# Patient Record
Sex: Male | Born: 1995 | Race: White | Hispanic: No | Marital: Single | Smoking: Former smoker
Health system: Southern US, Community
[De-identification: ages and names within clinical notes are randomized; demographics above are authoritative.]

## PROBLEM LIST (undated history)

## (undated) DIAGNOSIS — J45909 Unspecified asthma, uncomplicated: Secondary | ICD-10-CM

## (undated) HISTORY — PX: NO PAST SURGERIES: SHX2092

---
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
## 2014-06-03 ENCOUNTER — Emergency Department: Payer: Self-pay | Admitting: Emergency Medicine

## 2014-08-17 ENCOUNTER — Emergency Department
Admission: EM | Admit: 2014-08-17 | Discharge: 2014-08-17 | Disposition: A | Discharge status: Home / Self Care | Payer: No Typology Code available for payment source | Attending: Emergency Medicine | Admitting: Emergency Medicine

## 2014-08-17 ENCOUNTER — Encounter: Payer: Self-pay | Admitting: Emergency Medicine

## 2014-08-17 DIAGNOSIS — F329 Major depressive disorder, single episode, unspecified: Secondary | ICD-10-CM | POA: Insufficient documentation

## 2014-08-17 DIAGNOSIS — F1910 Other psychoactive substance abuse, uncomplicated: Secondary | ICD-10-CM

## 2014-08-17 DIAGNOSIS — F1510 Other stimulant abuse, uncomplicated: Secondary | ICD-10-CM | POA: Diagnosis not present

## 2014-08-17 DIAGNOSIS — F1210 Cannabis abuse, uncomplicated: Secondary | ICD-10-CM | POA: Diagnosis not present

## 2014-08-17 DIAGNOSIS — Z87891 Personal history of nicotine dependence: Secondary | ICD-10-CM | POA: Insufficient documentation

## 2014-08-17 DIAGNOSIS — F1410 Cocaine abuse, uncomplicated: Secondary | ICD-10-CM | POA: Insufficient documentation

## 2014-08-17 DIAGNOSIS — F1110 Opioid abuse, uncomplicated: Secondary | ICD-10-CM | POA: Insufficient documentation

## 2014-08-17 LAB — URINALYSIS COMPLETE WITH MICROSCOPIC (ARMC ONLY)
Bilirubin Urine: NEGATIVE
Glucose, UA: NEGATIVE mg/dL
Hgb urine dipstick: NEGATIVE
Ketones, ur: NEGATIVE mg/dL
LEUKOCYTES UA: NEGATIVE
Nitrite: NEGATIVE
PROTEIN: NEGATIVE mg/dL
SPECIFIC GRAVITY, URINE: 1.028 (ref 1.005–1.030)
pH: 5 (ref 5.0–8.0)

## 2014-08-17 LAB — URINE DRUG SCREEN, QUALITATIVE (ARMC ONLY)
AMPHETAMINES, UR SCREEN: POSITIVE — AB
BARBITURATES, UR SCREEN: NOT DETECTED
Benzodiazepine, Ur Scrn: NOT DETECTED
CANNABINOID 50 NG, UR ~~LOC~~: POSITIVE — AB
COCAINE METABOLITE, UR ~~LOC~~: POSITIVE — AB
MDMA (ECSTASY) UR SCREEN: NOT DETECTED
Methadone Scn, Ur: NOT DETECTED
OPIATE, UR SCREEN: POSITIVE — AB
Phencyclidine (PCP) Ur S: NOT DETECTED
TRICYCLIC, UR SCREEN: NOT DETECTED

## 2014-08-17 LAB — COMPREHENSIVE METABOLIC PANEL
ALT: 640 U/L — ABNORMAL HIGH (ref 17–63)
ANION GAP: 10 (ref 5–15)
AST: 317 U/L — AB (ref 15–41)
Albumin: 4.7 g/dL (ref 3.5–5.0)
Alkaline Phosphatase: 148 U/L — ABNORMAL HIGH (ref 38–126)
BILIRUBIN TOTAL: 1.4 mg/dL — AB (ref 0.3–1.2)
BUN: 12 mg/dL (ref 6–20)
CALCIUM: 9.5 mg/dL (ref 8.9–10.3)
CHLORIDE: 103 mmol/L (ref 101–111)
CO2: 26 mmol/L (ref 22–32)
CREATININE: 0.87 mg/dL (ref 0.61–1.24)
GLUCOSE: 108 mg/dL — AB (ref 65–99)
Potassium: 3.6 mmol/L (ref 3.5–5.1)
Sodium: 139 mmol/L (ref 135–145)
Total Protein: 8 g/dL (ref 6.5–8.1)

## 2014-08-17 LAB — CBC
HCT: 46.5 % (ref 40.0–52.0)
Hemoglobin: 15.4 g/dL (ref 13.0–18.0)
MCH: 30 pg (ref 26.0–34.0)
MCHC: 33 g/dL (ref 32.0–36.0)
MCV: 90.7 fL (ref 80.0–100.0)
Platelets: 272 10*3/uL (ref 150–440)
RBC: 5.12 MIL/uL (ref 4.40–5.90)
RDW: 14.9 % — ABNORMAL HIGH (ref 11.5–14.5)
WBC: 14.8 10*3/uL — ABNORMAL HIGH (ref 3.8–10.6)

## 2014-08-17 LAB — SALICYLATE LEVEL: Salicylate Lvl: <4 mg/dL (ref 2.8–30.0)

## 2014-08-17 LAB — ACETAMINOPHEN LEVEL

## 2014-08-17 LAB — ETHANOL

## 2014-08-17 MED ORDER — LORAZEPAM 1 MG PO TABS
ORAL_TABLET | ORAL | Status: AC
  Administered 2014-08-17: 1 mg via ORAL
  Filled 2014-08-17: qty 1

## 2014-08-17 MED ORDER — IBUPROFEN 100 MG/5ML PO SUSP: 600.0000 mg | Freq: Once | ORAL | Status: DC

## 2014-08-17 MED ORDER — IBUPROFEN 600 MG PO TABS
ORAL_TABLET | ORAL | Status: AC
  Administered 2014-08-17: 600 mg via ORAL
  Filled 2014-08-17: qty 1

## 2014-08-17 MED ORDER — LORAZEPAM 1 MG PO TABS
1.0000 mg | ORAL_TABLET | Freq: Once | ORAL | Status: AC
  Administered 2014-08-17: 1 mg via ORAL

## 2014-08-17 MED ORDER — SODIUM CHLORIDE 0.9 % IV BOLUS (SEPSIS): 1000.0000 mL | Freq: Once | INTRAVENOUS | Status: DC

## 2014-08-17 MED ORDER — IBUPROFEN 600 MG PO TABS
600.0000 mg | ORAL_TABLET | Freq: Once | ORAL | Status: AC
  Administered 2014-08-17: 600 mg via ORAL

## 2014-08-17 MED ORDER — NICOTINE 10 MG IN INHA
RESPIRATORY_TRACT | Status: AC
  Administered 2014-08-17: 1 via RESPIRATORY_TRACT
  Filled 2014-08-17: qty 36

## 2014-08-17 MED ORDER — NICOTINE 10 MG IN INHA
1.0000 | RESPIRATORY_TRACT | Status: DC | PRN
  Administered 2014-08-17 (×2): 1 via RESPIRATORY_TRACT

## 2014-08-17 NOTE — ED Provider Notes (Signed)
[facility] Emergency Department Provider Note  ____________________________________________  Time seen: Approximately 624 AM  I have reviewed the triage vital signs and the nursing notes.   HISTORY  Chief Complaint Drug Problem    HPI Jesse Sampson is a 19 y.o. male who comes in today requesting drug detox. The patient reports that he is withdrawing off of heroin. He reports that he has been using heroin daily for the last 6 months. He reports that he last used yesterday but again wants to have detox. The patient has not had detox before. The patient denies any suicidal or homicidal ideation. Although he is withdrawing he reports that he has not had any belly pain vomiting or nausea. The patient does have some headache with no blurred vision. He reports he does feel as though he is mildly depressed.   History reviewed. No pertinent past medical history.  There are no active problems to display for this patient.   History reviewed. No pertinent past surgical history.  No current outpatient prescriptions on file.  Allergies Review of patient's allergies indicates no known allergies.  History reviewed. No pertinent family history.  Social History History  Substance Use Topics  . Smoking status: Former Games developermoker  . Smokeless tobacco: Not on file  . Alcohol Use: Yes    Review of Systems Constitutional: No fever/chills Eyes: No visual changes. ENT: No sore throat. Cardiovascular: Denies chest pain. Respiratory: Denies shortness of breath. Gastrointestinal: No abdominal pain.  No nausea, no vomiting.  No diarrhea.  No constipation. Genitourinary: Negative for dysuria. Musculoskeletal: Negative for back pain. Skin: Negative for rash. Neurological: Take with no focal weakness or numbness. Psychiatric:Depression 10-point ROS otherwise negative.  ____________________________________________   PHYSICAL EXAM:  VITAL SIGNS: ED Triage Vitals  Enc  Vitals Group     BP 08/17/14 0353 125/73 mmHg     Pulse Rate 08/17/14 0353 138     Resp 08/17/14 0353 18     Temp 08/17/14 0353 98.2 F (36.8 C)     Temp Source 08/17/14 0353 Oral     SpO2 08/17/14 0353 100 %     Weight 08/17/14 0353 145 lb (65.772 kg)     Height 08/17/14 0353 5\' 11"  (1.803 m)     Head Cir --      Peak Flow --      Pain Score 08/17/14 0354 5     Pain Loc --      Pain Edu? --      Excl. in GC? --     Constitutional: Alert and oriented. Well appearing and in no acute distress. Eyes: Conjunctivae are normal. PERRL. EOMI. Head: Atraumatic. Nose: No congestion/rhinnorhea. Mouth/Throat: Mucous membranes are dry.  Oropharynx non-erythematous. Cardiovascular: Tachycardia. Grossly normal heart sounds.  Good peripheral circulation. Respiratory: Normal respiratory effort.  No retractions. Lungs CTAB. Gastrointestinal: Soft and nontender. No distention. Positive bowel sounds. Genitourinary:  Musculoskeletal: No lower extremity tenderness nor edema.  No joint effusions. Neurologic:  Normal speech and language. No gross focal neurologic deficits are appreciated.  Skin:  Skin is warm, dry and intact. No rash noted. Psychiatric: Mood and affect are normal. Speech and behavior are normal.  ____________________________________________   LABS (all labs ordered are listed, but only abnormal results are displayed)  Labs Reviewed  CBC - Abnormal; Notable for the following:    WBC 14.8 (*)    RDW 14.9 (*)    All other components within normal limits  COMPREHENSIVE METABOLIC PANEL - Abnormal; Notable for  the following:    Glucose, Bld 108 (*)    AST 317 (*)    ALT 640 (*)    Alkaline Phosphatase 148 (*)    Total Bilirubin 1.4 (*)    All other components within normal limits  ACETAMINOPHEN LEVEL - Abnormal; Notable for the following:    Acetaminophen (Tylenol), Serum <10 (*)    All other components within normal limits  URINE DRUG SCREEN, QUALITATIVE ([facility]) - Abnormal;  Notable for the following:    Amphetamines, Ur Screen POSITIVE (*)    Cocaine Metabolite,Ur [place] POSITIVE (*)    Opiate, Ur Screen POSITIVE (*)    Cannabinoid 50 Ng, Ur [place] POSITIVE (*)    All other components within normal limits  URINALYSIS COMPLETEWITH MICROSCOPIC ([facility])  - Abnormal; Notable for the following:    Color, Urine AMBER (*)    APPearance CLEAR (*)    Bacteria, UA RARE (*)    Squamous Epithelial / LPF 0-5 (*)    All other components within normal limits  ETHANOL  SALICYLATE LEVEL   ____________________________________________  EKG  ED ECG REPORT   Date: 08/17/2014  EKG Time: 421  Rate: 104  Rhythm: sinus tachycardia, incomplete right bundle branch block  Axis: Right axis deviation  Intervals:none  ST&T Change: Flipped T-wave in lead 3  ____________________________________________  RADIOLOGY  None ____________________________________________   PROCEDURES  Procedure(s) performed: None  Critical Care performed: No  ____________________________________________   INITIAL IMPRESSION / ASSESSMENT AND PLAN / ED COURSE  Pertinent labs & imaging results that were available during my care of the patient were reviewed by me and considered in my medical decision making (see chart for details).  Patient is a 19 year old male who comes in for detox from heroin. According to the patient's urine drug screen he has also been doing cocaine as well as amphetamines and marijuana. The patient will be seen by the behavioral nurse who will attempt to locate place for him to receive detox. The patient received 1 L of normal saline for his tachycardia as well as 1 mg of Ativan orally.  The patient will be signed out pending discussion with the behavioral services. ____________________________________________   FINAL CLINICAL IMPRESSION(S) / ED DIAGNOSES  Final diagnoses:  Polysubstance abuse  Tachycardia       Rebecka ApleyAllison P Webster, MD 08/17/14 [phone]

## 2014-08-17 NOTE — ED Notes (Signed)
BEHAVIORAL HEALTH ROUNDING Patient sleeping: Yes.   Patient alert and oriented: not applicable Behavior appropriate: Yes.  ; If no, describe:  Nutrition and fluids offered: Yes  Toileting and hygiene offered: Yes  Sitter present: no Law enforcement present: Yes ods

## 2014-08-17 NOTE — ED Notes (Signed)

## 2014-08-17 NOTE — BH Assessment (Signed)
Assessment Note  Jesse Sampson is an 19 y.o. male, who presents to the ED via his mother; requesting assistance with heroin detox with stating, "I asked my mother to bring me; I need help; I keep slipping." "I use a lot; I'm using heroin IV 1/2 gram a day; adderall 4-6 pills a day; beer 8-9 12 oz 2-3 x a week; and cocaine-snorting 1/2 gram a day; "I'm just using; I have never had any kind of treatment."   Axis I: Substance Abuse Axis II: Deferred Axis III: History reviewed. No pertinent past medical history. Axis IV: problems with access to health care services and problems with primary support group Axis V: 61-70 mild symptoms  Past Medical History: History reviewed. No pertinent past medical history.  History reviewed. No pertinent past surgical history.  Family History: History reviewed. No pertinent family history.  Social History:  reports that he has quit smoking. He does not have any smokeless tobacco history on file. He reports that he drinks alcohol. He reports that he uses illicit drugs (Cocaine).  Additional Social History:     CIWA: CIWA-Ar BP: 125/73 mmHg Pulse Rate: (!) 138 Nausea and Vomiting: no nausea and no vomiting Tactile Disturbances: none Tremor: no tremor Auditory Disturbances: not present Paroxysmal Sweats: no sweat visible Visual Disturbances: not present Anxiety: no anxiety, at ease Headache, Fullness in Head: none present Agitation: normal activity Orientation and Clouding of Sensorium: oriented and can do serial additions CIWA-Ar Total: 0 COWS: Clinical Opiate Withdrawal Scale (COWS) Resting Pulse Rate: Pulse Rate greater than 120 Sweating: No report of chills or flushing Restlessness: Able to sit still Pupil Size: Pupils pinned or normal size for room light Bone or Joint Aches: Not present Runny Nose or Tearing: Not present GI Upset: No GI symptoms Tremor: No tremor Yawning: No yawning Anxiety or Irritability: None Gooseflesh Skin: Skin is  smooth COWS Total Score: 4  Allergies: No Known Allergies  Home Medications:  (Not in a hospital admission)  OB/GYN Status:  No LMP for male patient.  General Assessment Data [facility] Assessment: In system Is this a Tele or Face-to-Face Assessment?: Face-to-Face Is this an Initial Assessment or a Re-assessment for this encounter?: Initial Assessment Marital status: Single Maiden name: none Is patient pregnant?: No Pregnancy Status: No Living Arrangements: Parent Can pt return to current living arrangement?: Yes Admission Status: Voluntary Is patient capable of signing voluntary admission?: Yes Referral Source: Self/Family/Friend Insurance type: Self Pay  Medical Screening Exam [facility] Walk-in ONLY) Medical Exam completed: Yes  Crisis Care Plan Living Arrangements: Parent Name of Psychiatrist: none Name of Therapist: none  Education Status Is patient currently in school?: No Current Grade: n/a Highest grade of school patient has completed: 11th Name of school: n/a Contact person: mother  Risk to self with the past 6 months Suicidal Ideation: No Has patient been a risk to self within the past 6 months prior to admission? : No Suicidal Intent: No Has patient had any suicidal intent within the past 6 months prior to admission? : No Is patient at risk for suicide?: No Suicidal Plan?: No Has patient had any suicidal plan within the past 6 months prior to admission? : No Access to Means: No What has been your use of drugs/alcohol within the last 12 months?: heroin; adderall; beer; cocaine Previous Attempts/Gestures: No How many times?: 0 Other Self Harm Risks: substance use Triggers for Past Attempts: Family contact Intentional Self Injurious Behavior: None Family Suicide History: No Recent  stressful life event(s): Conflict (Comment) Persecutory voices/beliefs?: No Depression: Yes Depression Symptoms: Feeling worthless/self pity Substance  abuse history and/or treatment for substance abuse?:  (has been using since the of 14; no h/o treatment) Suicide prevention information given to non-admitted patients: Yes  Risk to Others within the past 6 months Homicidal Ideation: No Does patient have any lifetime risk of violence toward others beyond the six months prior to admission? : No Thoughts of Harm to Others: No Current Homicidal Intent: No Current Homicidal Plan: No Access to Homicidal Means: No Identified Victim: none History of harm to others?: No Assessment of Violence: None Noted Violent Behavior Description: none Does patient have access to weapons?: No Criminal Charges Pending?: No Does patient have a court date: No Is patient on probation?: No  Psychosis Hallucinations: None noted Delusions: None noted  Mental Status Report Appearance/Hygiene: Poor hygiene, Disheveled, Body odor, In scrubs Eye Contact: Fair Motor Activity: Restlessness (constant foot movement) Speech: Soft, Slow, Slurred Level of Consciousness: Quiet/awake Mood: Helpless ("I need help.") Affect: Anxious Anxiety Level: Minimal Thought Processes: Circumstantial, Relevant, Coherent Judgement: Partial Orientation: Person, Place, Time, Situation Obsessive Compulsive Thoughts/Behaviors: None  Cognitive Functioning Concentration: Fair Memory: Recent Intact, Remote Intact IQ: Average Insight: Fair Impulse Control: Good Appetite: Fair Weight Loss: 0 Weight Gain: 0 Sleep: No Change Total Hours of Sleep: 6 Vegetative Symptoms: None  ADLScreening [facility] Assessment Services) Patient's cognitive ability adequate to safely complete daily activities?: Yes Patient able to express need for assistance with ADLs?: Yes Independently performs ADLs?: Yes (appropriate for developmental age)  Prior Inpatient Therapy Prior Inpatient Therapy: No Prior Therapy Dates: none Prior Therapy Facilty/Provider(s): none Reason for Treatment: none  Prior  Outpatient Therapy Prior Outpatient Therapy: No Prior Therapy Dates: none Prior Therapy Facilty/Provider(s): none Reason for Treatment: none Does patient have an ACCT team?: No Does patient have Intensive In-House Services?  : No Does patient have [facility] services? : No Does patient have P4CC services?: No  ADL Screening (condition at time of admission) Patient's cognitive ability adequate to safely complete daily activities?: Yes Patient able to express need for assistance with ADLs?: Yes Independently performs ADLs?: Yes (appropriate for developmental age)       Abuse/Neglect Assessment (Assessment to be complete while patient is alone) Physical Abuse: Denies Verbal Abuse: Denies Sexual Abuse: Denies Exploitation of patient/patient's resources: Denies Self-Neglect: Denies Values / Beliefs Cultural Requests During Hospitalization: None Spiritual Requests During Hospitalization: None Consults Spiritual Care Consult Needed: No Social Work Consult Needed: No Merchant navy officerAdvance Directives (For Healthcare) Does patient have an advance directive?: No Would patient like information on creating an advanced directive?: No - patient declined information    Additional Information 1:1 In Past 12 Months?: No CIRT Risk: No Elopement Risk: No Does patient have medical clearance?: Yes  Child/Adolescent Assessment Running Away Risk: Denies Bed-Wetting: Denies Destruction of Property: Denies Cruelty to Animals: Denies Stealing: Denies Rebellious/Defies Authority: Denies Satanic Involvement: Denies Archivistire Setting: Denies Problems at Progress EnergySchool: Denies Gang Involvement: Denies  Disposition:  Disposition Initial Assessment Completed for this Encounter: Yes Disposition of Patient: Referred to, Inpatient treatment program Type of inpatient treatment program: Adult Patient referred to: [facility]  On Site Evaluation by:   Reviewed with Physician:    Dwan BoltMargaret Merve Hotard 08/17/2014 5:22 AM

## 2014-08-17 NOTE — ED Notes (Signed)
BEHAVIORAL HEALTH ROUNDING Patient sleeping: No. Patient alert and oriented: yes Behavior appropriate: Yes.  ; If no, describe:  Nutrition and fluids offered: Yes  Toileting and hygiene offered: Yes  Sitter present: no Law enforcement present: Yes  

## 2014-08-17 NOTE — [facility] Counselor (Signed)
Writer spoke with [facility] [phone]([phone]), they will not have any beds until Monday (08/19/2014) but they will start taking pt. From there ER first. Thus, their is no guarantee if outside referrals will be considered.

## 2014-08-17 NOTE — ED Provider Notes (Signed)
-----------------------------------------   3:27 PM on 08/17/2014 -----------------------------------------    Behavioral health has seen patient and as discussed patient to go to [facility] in [place]. Patient will have to transport himself to [facility]. Will give patient [facility] addressed and discharged with a work.  Jesse SemenGraydon Georgiana Spillane, MD 08/17/14 [phone]

## 2014-08-17 NOTE — Discharge Instructions (Signed)
Please seek medical attention for any high fevers, chest pain, shortness of breath, change in behavior, persistent vomiting, bloody stool or any other new or concerning symptoms.  Chemical Dependency Chemical dependency is an addiction to drugs or alcohol. It is characterized by the repeated behavior of seeking out and using drugs and alcohol despite harmful consequences to the health and safety of ones self and others.  RISK FACTORS There are certain situations or behaviors that increase a person's risk for chemical dependency. These include:  A family history of chemical dependency.  A history of mental health issues, including depression and anxiety.  A home environment where drugs and alcohol are easily available to you.  Drug or alcohol use at a young age. SYMPTOMS  The following symptoms can indicate chemical dependency:  Inability to limit the use of drugs or alcohol.  Nausea, sweating, shakiness, and anxiety that occurs when alcohol or drugs are not being used.  An increase in amount of drugs or alcohol that is necessary to get drunk or high. People who experience these symptoms can assess their use of drugs and alcohol by asking themselves the following questions:  Have you been told by friends or family that they are worried about your use of alcohol or drugs?  Do friends and family ever tell you about things you did while drinking alcohol or using drugs that you do not remember?  Do you lie about using alcohol or drugs or about the amounts you use?  Do you have difficulty completing daily tasks unless you use alcohol or drugs?  Is the level of your work or school performance lower because of your drug or alcohol use?  Do you get sick from using drugs or alcohol but keep using anyway?  Do you feel uncomfortable in social situations unless you use alcohol or drugs?  Do you use drugs or alcohol to help forget problems? An answer of yes to any of these questions may  indicate chemical dependency. Professional evaluation is suggested. Document Released: 03/16/2001 Document Revised: 06/14/2011 Document Reviewed: 05/28/2010 [facility] Patient Information 2015 [place]. This information is not intended to replace advice given to you by your health care provider. Make sure you discuss any questions you have with your health care provider.

## 2014-08-17 NOTE — ED Notes (Signed)
BEHAVIORAL HEALTH ROUNDING Patient sleeping: Yes.   Patient alert and oriented: no Behavior appropriate: Yes.  ; If no, describe:  Nutrition and fluids offered: Sleeping  Toileting and hygiene offered: Sleeping  Sitter present: yes Law enforcement present: Yes

## 2014-08-17 NOTE — ED Notes (Signed)
BEHAVIORAL HEALTH ROUNDING Patient sleeping: No. Patient alert and oriented: yes Behavior appropriate: Yes.  ; If no, describe: Nutrition and fluids offered: Yes  Toileting and hygiene offered: Yes  Sitter present: no Law enforcement present: Yes ods 

## 2014-08-17 NOTE — [facility] Counselor (Signed)
Discussed pt. with ER MD (Dr. Virginia CrewsG. Goodman) and , pt. is able to d/c home when medically cleared. Pt. have been giving information and instructions on how to follow up with [facility] and [facility].

## 2014-08-17 NOTE — [facility] Counselor (Signed)
Explored options for treatment with pt. [facility]ax[facility] to [facility].   Unable to go to [facility]([phone]) due to having insurance.  [facility] ([phone]), is unable to verify pt. Insurance and possible co-pay until Monday (08/19/2014).  [facility] ([phone]), have no beds

## 2014-08-17 NOTE — ED Notes (Signed)
BEHAVIORAL HEALTH ROUNDING Patient sleeping: Yes.   Patient alert and oriented: no Behavior appropriate: Yes.  ; If no, describe:  Nutrition and fluids offered: No Toileting and hygiene offered: No Sitter present: no Law enforcement present: Yes ods

## 2014-08-17 NOTE — ED Notes (Signed)
Pt here for heroin detox, has been using for 4 months, no SI.

## 2014-12-11 ENCOUNTER — Ambulatory Visit
Admission: EM | Admit: 2014-12-11 | Discharge: 2014-12-11 | Disposition: A | Discharge status: Home / Self Care | Payer: No Typology Code available for payment source | Attending: Family Medicine | Admitting: Family Medicine

## 2014-12-11 DIAGNOSIS — J302 Other seasonal allergic rhinitis: Secondary | ICD-10-CM

## 2014-12-11 DIAGNOSIS — J0110 Acute frontal sinusitis, unspecified: Secondary | ICD-10-CM

## 2014-12-11 DIAGNOSIS — J40 Bronchitis, not specified as acute or chronic: Secondary | ICD-10-CM | POA: Diagnosis not present

## 2014-12-11 HISTORY — DX: Unspecified asthma, uncomplicated: J45.909

## 2014-12-11 MED ORDER — AZITHROMYCIN 250 MG PO TABS: ORAL_TABLET | ORAL | Status: DC

## 2014-12-11 MED ORDER — LORATADINE 10 MG PO TABS: 10.0000 mg | ORAL_TABLET | Freq: Every day | ORAL | Status: DC

## 2014-12-11 MED ORDER — PSEUDOEPH-BROMPHEN-DM 30-2-10 MG/5ML PO SYRP: 5.0000 mL | ORAL_SOLUTION | Freq: Four times a day (QID) | ORAL | Status: DC | PRN

## 2014-12-11 NOTE — ED Provider Notes (Signed)
[facility] Emergency Department Provider Note  ____________________________________________  Time seen: Approximately 8:53 AM  I have reviewed the triage vital signs and the nursing notes.   HISTORY  Chief Complaint URI   HPI Jesse Sampson is a 19 y.o. male presents for complaints of runny nose, congestion, sinus pressure and intermittent cough. States started out with congestion and sinus pressure but now with intermittent cough. States cough is mostly at night with post nasal drainage. Denies fevers. Reports continues to eat and drink well. States has seasonal allergies every year at this time due to ragweed. 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   Denies chest pain, shortness of breath, abdominal pain, fevers. Reports continues to eat and drink well. Denies wheezing or shortness of breath.    Past Medical History  Diagnosis Date  . Asthma     There are no active problems to display for this patient.   History reviewed. No pertinent past surgical history.  Current Outpatient Rx  Name  Route  Sig  Dispense  Refill  . albuterol (PROVENTIL HFA;VENTOLIN HFA) 108 (90 BASE) MCG/ACT inhaler   Inhalation   Inhale 2 puffs into the lungs every 6 (six) hours as needed for wheezing or shortness of breath.         .             Allergies Sulfa antibiotics  History reviewed. No pertinent family history.  Social History Social History  Substance Use Topics  . Smoking status: Current Every Day Smoker -- 1.00 packs/day    Types: Cigarettes  . Smokeless tobacco: None  . Alcohol Use: Yes  Denies drug use.   Review of Systems Constitutional: No fever/chills Eyes: No visual changes. ENT: Positive for runny nose, congestion and intermittent cough. Negative for sore throat. Cardiovascular: Denies chest pain. Respiratory: Denies shortness of breath. Gastrointestinal: No abdominal pain.  No nausea, no vomiting.  No  diarrhea.  No constipation. Genitourinary: Negative for dysuria. Musculoskeletal: Negative for back pain. Skin: Negative for rash. Neurological: Negative for headaches, focal weakness or numbness.  10-point ROS otherwise negative.  ____________________________________________   PHYSICAL EXAM:  VITAL SIGNS: ED Triage Vitals  Enc Vitals Group     BP 12/11/14 0831 105/69 mmHg     Pulse Rate 12/11/14 0831 92     Resp 12/11/14 0831 17     Temp 12/11/14 0831 98.2 F (36.8 C)     Temp Source 12/11/14 0831 Tympanic     SpO2 12/11/14 0831 99 %     Weight 12/11/14 0831 155 lb (70.308 kg)     Height 12/11/14 0831  (1.88 m)     Head Cir --      Peak Flow --      Pain Score 12/11/14 0834 6     Pain Loc --      Pain Edu? --      Excl. in GC? --     Constitutional: Alert and oriented. Well appearing and in no acute distress. Eyes: Conjunctivae are normal. PERRL. EOMI. Head: Atraumatic.Mild maxillary sinus TTP, mod frontal sinus TTP. No erythema.   Ears: no erythema, normal TMs bilaterally.   Nose: Clear rhinorrhea. Bilateral nasal turbinate edema, bilateral nares patent.  Mouth/Throat: Mucous membranes are moist.  Oropharynx non-erythematous. Neck: No stridor.  No cervical spine tenderness to palpation. Hematological/Lymphatic/Immunilogical: No cervical lymphadenopathy. Cardiovascular: Normal rate, regular rhythm. Grossly normal heart sounds.  Good peripheral circulation. Respiratory: Normal respiratory effort.  No retractions.Lungs clear throughout bilaterally. No wheezes rales or rhonchi. Dry intermittent cough in room. Gastrointestinal: Soft and nontender. No distention. Normal Bowel sounds.No CVA tenderness. Musculoskeletal: No lower or upper extremity tenderness nor edema.  No joint effusions. Bilateral pedal pulses equal and easily palpated.  Neurologic:  Normal speech and language. No gross focal neurologic deficits are appreciated. No gait instability. Skin:  Skin is warm,  dry and intact. No rash noted. Psychiatric: Mood and affect are normal. Speech and behavior are normal.  ____________________________________________   LABS (all labs ordered are listed, but only abnormal results are displayed)  Labs Reviewed - No data to display   INITIAL IMPRESSION / ASSESSMENT AND PLAN / ED COURSE  Pertinent labs & imaging results that were available during my care of the patient were reviewed by me and considered in my medical decision making (see chart for details).  Very well appearing patient. No acute distress. Presents for 7-8 days of runny nose, congestion, sinus pressure with intermittent cough. States history of seasonal allergies which often triggered by ragweed. Will treat frontal sinusitis and bronchitis with oral azithromycin and prn bromfed. Discussed seasonal allergies medication and patient requests rx for claritin and will plan to start at completion of bromfed. Counseled regarding smoking cessation. Discussed follow up and return parameters. Patient verbalized understanding and agreed to plan.  ____________________________________________   FINAL CLINICAL IMPRESSION(S) / ED DIAGNOSES  Final diagnoses:  Acute frontal sinusitis, recurrence not specified  Bronchitis  Seasonal allergies       Renford Dills, NP 12/11/14 0912  Renford Dills, NP 12/11/14 9604

## 2014-12-11 NOTE — Discharge Instructions (Signed)
Take medication as prescribed. Rest. Drink plenty of fluids. Try to quit smoking as discussed.   Follow up with your primary care physician this week as needed. Return to Urgent care for new or worsening concerns.   Sinusitis Sinusitis is redness, soreness, and puffiness (inflammation) of the air pockets in the bones of your face (sinuses). The redness, soreness, and puffiness can cause air and mucus to get trapped in your sinuses. This can allow germs to grow and cause an infection.  HOME CARE   Drink enough fluids to keep your pee (urine) clear or pale yellow.  Use a humidifier in your home.  Run a hot shower to create steam in the bathroom. Sit in the bathroom with the door closed. Breathe in the steam 3-4 times a day.  Put a warm, moist washcloth on your face 3-4 times a day, or as told by your doctor.  Use salt water sprays (saline sprays) to wet the thick fluid in your nose. This can help the sinuses drain.  Only take medicine as told by your doctor. GET HELP RIGHT AWAY IF:   Your pain gets worse.  You have very bad headaches.  You are sick to your stomach (nauseous).  You throw up (vomit).  You are very sleepy (drowsy) all the time.  Your face is puffy (swollen).  Your vision changes.  You have a stiff neck.  You have trouble breathing. MAKE SURE YOU:   Understand these instructions.  Will watch your condition.  Will get help right away if you are not doing well or get worse. Document Released: 09/08/2007 Document Revised: 12/15/2011 Document Reviewed: 10/26/2011 [facility] Patient Information 2015 [place], [place]. This information is not intended to replace advice given to you by your health care provider. Make sure you discuss any questions you have with your health care provider.  Hay Fever Hay fever is an allergic reaction to particles in the air. It cannot be passed from person to person. It cannot be cured, but it can be controlled. CAUSES  Hay fever is  caused by something that triggers an allergic reaction (allergens). The following are examples of allergens:  Ragweed.  Feathers.  Animal dander.  Grass and tree pollens.  Cigarette smoke.  House dust.  Pollution. SYMPTOMS   Sneezing.  Runny or stuffy nose.  Tearing eyes.  Itchy eyes, nose, mouth, throat, skin, or other area.  Sore throat.  Headache.  Decreased sense of smell or taste. DIAGNOSIS Your caregiver will perform a physical exam and ask questions about the symptoms you are having.Allergy testing may be done to determine exactly what triggers your hay fever.  TREATMENT   Over-the-counter medicines may help symptoms. These include:  Antihistamines.  Decongestants. These may help with nasal congestion.  Your caregiver may prescribe medicines if over-the-counter medicines do not work.  Some people benefit from allergy shots when other medicines are not helpful. HOME CARE INSTRUCTIONS   Avoid the allergen that is causing your symptoms, if possible.  Take all medicine as told by your caregiver. SEEK MEDICAL CARE IF:   You have severe allergy symptoms and your current medicines are not helping.  Your treatment was working at one time, but you are now experiencing symptoms.  You have sinus congestion and pressure.  You develop a fever or headache.  You have thick nasal discharge.  You have asthma and have a worsening cough and wheezing. SEEK IMMEDIATE MEDICAL CARE IF:   You have swelling of your tongue or lips.  You have trouble breathing.  You feel lightheaded or like you are going to faint.  You have cold sweats.  You have a fever. Document Released: 03/22/2005 Document Revised: 06/14/2011 Document Reviewed: 06/17/2010 [facility] Patient Information 2015 [place], [place]. This information is not intended to replace advice given to you by your health care provider. Make sure you discuss any questions you have with your health care  provider.

## 2014-12-11 NOTE — ED Notes (Signed)
Started 1 week ago with sinus congestion, post nasal drip and now coughing "green" phlegm.

## 2015-05-28 ENCOUNTER — Observation Stay
Admission: EM | Admit: 2015-05-28 | Discharge: 2015-05-29 | Disposition: A | Discharge status: Home / Self Care | Payer: No Typology Code available for payment source | Attending: Internal Medicine | Admitting: Internal Medicine

## 2015-05-28 DIAGNOSIS — Z801 Family history of malignant neoplasm of trachea, bronchus and lung: Secondary | ICD-10-CM | POA: Insufficient documentation

## 2015-05-28 DIAGNOSIS — R0681 Apnea, not elsewhere classified: Secondary | ICD-10-CM | POA: Diagnosis present

## 2015-05-28 DIAGNOSIS — J4520 Mild intermittent asthma, uncomplicated: Secondary | ICD-10-CM | POA: Diagnosis not present

## 2015-05-28 DIAGNOSIS — Y929 Unspecified place or not applicable: Secondary | ICD-10-CM | POA: Diagnosis not present

## 2015-05-28 DIAGNOSIS — F1910 Other psychoactive substance abuse, uncomplicated: Secondary | ICD-10-CM | POA: Insufficient documentation

## 2015-05-28 DIAGNOSIS — F17210 Nicotine dependence, cigarettes, uncomplicated: Secondary | ICD-10-CM | POA: Insufficient documentation

## 2015-05-28 DIAGNOSIS — Z7951 Long term (current) use of inhaled steroids: Secondary | ICD-10-CM | POA: Insufficient documentation

## 2015-05-28 DIAGNOSIS — Z882 Allergy status to sulfonamides status: Secondary | ICD-10-CM | POA: Diagnosis not present

## 2015-05-28 DIAGNOSIS — T50901A Poisoning by unspecified drugs, medicaments and biological substances, accidental (unintentional), initial encounter: Secondary | ICD-10-CM | POA: Diagnosis present

## 2015-05-28 DIAGNOSIS — F10129 Alcohol abuse with intoxication, unspecified: Secondary | ICD-10-CM | POA: Diagnosis present

## 2015-05-28 DIAGNOSIS — F10929 Alcohol use, unspecified with intoxication, unspecified: Secondary | ICD-10-CM

## 2015-05-28 DIAGNOSIS — R092 Respiratory arrest: Secondary | ICD-10-CM | POA: Insufficient documentation

## 2015-05-28 DIAGNOSIS — I469 Cardiac arrest, cause unspecified: Secondary | ICD-10-CM

## 2015-05-28 DIAGNOSIS — T402X1A Poisoning by other opioids, accidental (unintentional), initial encounter: Secondary | ICD-10-CM | POA: Diagnosis not present

## 2015-05-28 LAB — COMPREHENSIVE METABOLIC PANEL
ALT: 84 U/L — AB (ref 17–63)
AST: 42 U/L — AB (ref 15–41)
Albumin: 4 g/dL (ref 3.5–5.0)
Alkaline Phosphatase: 59 U/L (ref 38–126)
Anion gap: 9 (ref 5–15)
BUN: 11 mg/dL (ref 6–20)
CHLORIDE: 112 mmol/L — AB (ref 101–111)
CO2: 22 mmol/L (ref 22–32)
CREATININE: 0.81 mg/dL (ref 0.61–1.24)
Calcium: 8.8 mg/dL — ABNORMAL LOW (ref 8.9–10.3)
GFR calc Af Amer: >60 mL/min (ref >60)
GFR calc non Af Amer: >60 mL/min (ref >60)
Glucose, Bld: 77 mg/dL (ref 65–99)
POTASSIUM: 3.5 mmol/L (ref 3.5–5.1)
SODIUM: 143 mmol/L (ref 135–145)
Total Bilirubin: 0.3 mg/dL (ref 0.3–1.2)
Total Protein: 6.7 g/dL (ref 6.5–8.1)

## 2015-05-28 LAB — URINE DRUG SCREEN, QUALITATIVE (ARMC ONLY)
AMPHETAMINES, UR SCREEN: NOT DETECTED
BARBITURATES, UR SCREEN: NOT DETECTED
BENZODIAZEPINE, UR SCRN: NOT DETECTED
Cannabinoid 50 Ng, Ur ~~LOC~~: NOT DETECTED
Cocaine Metabolite,Ur ~~LOC~~: NOT DETECTED
MDMA (Ecstasy)Ur Screen: NOT DETECTED
Methadone Scn, Ur: NOT DETECTED
OPIATE, UR SCREEN: POSITIVE — AB
Phencyclidine (PCP) Ur S: NOT DETECTED
Tricyclic, Ur Screen: NOT DETECTED

## 2015-05-28 LAB — CBC
HEMATOCRIT: 41.1 % (ref 40.0–52.0)
Hemoglobin: 13.9 g/dL (ref 13.0–18.0)
MCH: 31.1 pg (ref 26.0–34.0)
MCHC: 33.7 g/dL (ref 32.0–36.0)
MCV: 92.3 fL (ref 80.0–100.0)
PLATELETS: 260 10*3/uL (ref 150–440)
RBC: 4.45 MIL/uL (ref 4.40–5.90)
RDW: 13.7 % (ref 11.5–14.5)
WBC: 11.6 10*3/uL — ABNORMAL HIGH (ref 3.8–10.6)

## 2015-05-28 LAB — TROPONIN I: Troponin I: <0.03 ng/mL (ref <0.031)

## 2015-05-28 LAB — ETHANOL: ALCOHOL ETHYL (B): 166 mg/dL — AB (ref <5)

## 2015-05-28 MED ORDER — SODIUM CHLORIDE 0.9 % IV BOLUS (SEPSIS)
1000.0000 mL | Freq: Once | INTRAVENOUS | Status: AC
Start: 2015-05-28 — End: 2015-05-28
  Administered 2015-05-28: 1000 mL via INTRAVENOUS

## 2015-05-28 MED ORDER — ACETAMINOPHEN 650 MG RE SUPP: 650.0000 mg | Freq: Four times a day (QID) | RECTAL | Status: DC | PRN

## 2015-05-28 MED ORDER — SODIUM CHLORIDE 0.9% FLUSH
3.0000 mL | Freq: Two times a day (BID) | INTRAVENOUS | Status: DC
  Administered 2015-05-29: 3 mL via INTRAVENOUS

## 2015-05-28 MED ORDER — ACETAMINOPHEN 325 MG PO TABS
650.0000 mg | ORAL_TABLET | Freq: Four times a day (QID) | ORAL | Status: DC | PRN
  Administered 2015-05-29: 650 mg via ORAL
  Filled 2015-05-28: qty 2

## 2015-05-28 MED ORDER — NICOTINE 21 MG/24HR TD PT24
21.0000 mg | MEDICATED_PATCH | Freq: Once | TRANSDERMAL | Status: DC
  Administered 2015-05-28: 21 mg via TRANSDERMAL
  Filled 2015-05-28: qty 1

## 2015-05-28 MED ORDER — LORAZEPAM 0.5 MG PO TABS: 0.5000 mg | ORAL_TABLET | Freq: Two times a day (BID) | ORAL | Status: DC | PRN

## 2015-05-28 MED ORDER — ONDANSETRON HCL 4 MG/2ML IJ SOLN: 4.0000 mg | Freq: Four times a day (QID) | INTRAMUSCULAR | Status: DC | PRN

## 2015-05-28 MED ORDER — ONDANSETRON HCL 4 MG PO TABS: 4.0000 mg | ORAL_TABLET | Freq: Four times a day (QID) | ORAL | Status: DC | PRN

## 2015-05-28 MED ORDER — ENOXAPARIN SODIUM 40 MG/0.4ML ~~LOC~~ SOLN: 40.0000 mg | SUBCUTANEOUS | Status: DC

## 2015-05-28 MED ORDER — SODIUM CHLORIDE 0.9 % IV BOLUS (SEPSIS)
1000.0000 mL | Freq: Once | INTRAVENOUS | Status: AC
  Administered 2015-05-29: 1000 mL via INTRAVENOUS

## 2015-05-28 MED ORDER — DIPHENHYDRAMINE HCL 25 MG PO CAPS: 25.0000 mg | ORAL_CAPSULE | Freq: Two times a day (BID) | ORAL | Status: DC | PRN

## 2015-05-28 MED ORDER — POLYETHYLENE GLYCOL 3350 17 G PO PACK: 17.0000 g | PACK | Freq: Every day | ORAL | Status: DC | PRN

## 2015-05-28 NOTE — ED Notes (Signed)
Sheriff at bedside.  

## 2015-05-28 NOTE — ED Provider Notes (Signed)
[facility] Emergency Department Provider Note  ____________________________________________  Time seen: Approximately 8:56 PM  I have reviewed the triage vital signs and the nursing notes.   HISTORY  Chief Complaint Drug Overdose    HPI Jesse Sampson is a 20 y.o. male with a history of polysubstance abuse and alcohol dependence brought by EMS for cardiac arrest after overdose. The patient reports that around 7:30 PM this evening he injected a white powder into his right upper extremity which he was told was heroin " but it felt more like fentanyl."Then he called his mom and the events are unclear to him. Per report, the patient had a witnessed arrest and received several rounds of CPR by EMS. He received 2 mg intranasally of Narcan followed by 2 mg IV at which point he had return of spontaneous circulation and became alert and oriented with stable vital signs.  The patient also reports a significant amount of alcohol intake tonight.  He denies any SI, HI or hallucinations. He has been drug free except for marijuana for 3 months.   Past Medical History  Diagnosis Date  . Asthma     There are no active problems to display for this patient.   History reviewed. No pertinent past surgical history.  Current Outpatient Rx  Name  Route  Sig  Dispense  Refill  . albuterol (PROVENTIL HFA;VENTOLIN HFA) 108 (90 BASE) MCG/ACT inhaler   Inhalation   Inhale 2 puffs into the lungs every 6 (six) hours as needed for wheezing or shortness of breath.         Marland Kitchen azithromycin (ZITHROMAX Z-PAK) 250 MG tablet      Take 2 tablets (500 mg) on  Day 1,  followed by 1 tablet (250 mg) once daily on Days 2 through 5.   6 each   0   . brompheniramine-pseudoephedrine-DM 30-2-10 MG/5ML syrup   Oral   Take 5 mLs by mouth 4 (four) times daily as needed (cough congestion).   100 mL   0   . fluticasone (FLONASE) 50 MCG/ACT nasal spray   Each Nare   Place 2 sprays into both  nostrils daily.         Marland Kitchen loratadine (CLARITIN) 10 MG tablet   Oral   Take 1 tablet (10 mg total) by mouth daily.   30 tablet   0     Allergies Sulfa antibiotics  No family history on file.  Social History Social History  Substance Use Topics  . Smoking status: Current Every Day Smoker -- 1.00 packs/day    Types: Cigarettes  . Smokeless tobacco: None  . Alcohol Use: Yes    Review of Systems Constitutional: No fever/chills. Positive unresponsiveness with pulselessness and apnea. Eyes: No visual changes. ENT: No sore throat. Cardiovascular: Denies chest pain, palpitations. Respiratory: Denies shortness of breath.  No cough. Gastrointestinal: No abdominal pain.  No nausea, no vomiting.  No diarrhea.  No constipation. Genitourinary: Negative for dysuria. Musculoskeletal: Negative for back pain. Skin: Negative for rash. Neurological: Negative for headaches, focal weakness or numbness.  10-point ROS otherwise negative.  ____________________________________________   PHYSICAL EXAM:  VITAL SIGNS: ED Triage Vitals  Enc Vitals Group     BP 05/28/15 2032 116/82 mmHg     Pulse Rate 05/28/15 2032 93     Resp 05/28/15 2032 9     Temp 05/28/15 2032 98.6 F (37 C)     Temp src --      SpO2 05/28/15 2032  98 %     Weight 05/28/15 2032 157 lb (71.215 kg)     Height 05/28/15 2032 6' (1.829 m)     Head Cir --      Peak Flow --      Pain Score --      Pain Loc --      Pain Edu? --      Excl. in GC? --     Constitutional: On arrival, the patient is alert oriented and tearful. He is answering questions appropriately.  Eyes: Conjunctivae are normal.  EOMI. pupils are 3 mm and symmetric and reactive bilaterally. Head: Atraumatic. Nose: No congestion/rhinnorhea. Mouth/Throat: Mucous membranes are moist.  Neck: No stridor.  Supple.  No JVD. Cardiovascular: Fast rate, regular rhythm. No murmurs, rubs or gallops.  Respiratory: Normal respiratory effort.  No retractions.  Lungs CTAB.  No wheezes, rales or ronchi. Gastrointestinal: Soft and nontender. No distention. No peritoneal signs. Musculoskeletal: No LE edema.  Neurologic:  Normal speech and language. No gross focal neurologic deficits are appreciated.  Skin:  Skin is warm, dry and intact. Patient has a diffuse lenticular rash over the right inner bicep as well as the chest without any raised lesion, overlying purulence or erythema, which she states has been there "for while." Psychiatric: Mood and affect are normal. Speech and behavior are normal.  Normal judgement.  ____________________________________________   LABS (all labs ordered are listed, but only abnormal results are displayed)  Labs Reviewed  CBC - Abnormal; Notable for the following:    WBC 11.6 (*)    All other components within normal limits  URINE DRUG SCREEN, QUALITATIVE ([facility] ONLY) - Abnormal; Notable for the following:    Opiate, Ur Screen POSITIVE (*)    All other components within normal limits  ETHANOL - Abnormal; Notable for the following:    Alcohol, Ethyl (B) 166 (*)    All other components within normal limits  COMPREHENSIVE METABOLIC PANEL - Abnormal; Notable for the following:    Chloride 112 (*)    Calcium 8.8 (*)    AST 42 (*)    ALT 84 (*)    All other components within normal limits  TROPONIN I   ____________________________________________  EKG  ED ECG REPORT I, Rockne Menghini, the attending physician, personally viewed and interpreted this ECG.   Date: 05/28/2015  EKG Time: 2033  Rate: 91  Rhythm: normal sinus rhythm  Axis: Normal  Intervals:none  ST&T Change: Peak T waves in V3 V4 and V5. 0.5 mm ST elevations isolated to V2. Nonspecific T-wave inversions in V1.  ____________________________________________  RADIOLOGY  No results found.  ____________________________________________   PROCEDURES  Procedure(s) performed: None  Critical Care performed: Yes, see  note ____________________________________________   INITIAL IMPRESSION / ASSESSMENT AND PLAN / ED COURSE  Pertinent labs & imaging results that were available during my care of the patient were reviewed by me and considered in my medical decision making (see chart for details).  20 y.o. male with a history of alcohol and polysubstance abuse presenting with acute cardiac arrest in the setting of drug overdose. The likely cause was opioid overdose, although it is still not completely clear what type of drug the patient ingested. At this time, the patient is tachycardic but otherwise hemodynamically stable, alert and oriented, and protecting his airway. He will require admission for postresuscitation care and continued monitoring, as well as a more thorough psychiatric evaluation.  CRITICAL CARE Performed by: Rockne Menghini   Total critical care  time: 35 minutes  Critical care time was exclusive of separately billable procedures and treating other patients.  Critical care was necessary to treat or prevent imminent or life-threatening deterioration.  Critical care was time spent personally by me on the following activities: development of treatment plan with patient and/or surrogate as well as nursing, discussions with consultants, evaluation of patient's response to treatment, examination of patient, obtaining history from patient or surrogate, ordering and performing treatments and interventions, ordering and review of laboratory studies, ordering and review of radiographic studies, pulse oximetry and re-evaluation of patient's condition.  ----------------------------------------- 10:37 PM on 05/28/2015 -----------------------------------------  The patient continues to remain stable and his heart rate is now in the 90s. His pupils are down to 2 mm bilaterally but he is still breathing normally and mentating normally. He is protecting his airway. He is on continuous cardiac monitoring  and has been closely watched by myself and the nursing staff. At this time, no additional Narcan is required but if he continues to have any evidence of symptoms, we will consider putting him on a Narcan drip. The patient has been admitted to the hospitalist.  ____________________________________________  FINAL CLINICAL IMPRESSION(S) / ED DIAGNOSES  Final diagnoses:  Cardiac arrest (HCC)  Apnea  Drug overdose, accidental or unintentional, initial encounter  Alcohol intoxication, with unspecified complication (HCC)      NEW MEDICATIONS STARTED DURING THIS VISIT:  New Prescriptions   No medications on file     Rockne Menghini, MD 05/28/15 2238

## 2015-05-28 NOTE — ED Notes (Signed)
Pt arrived via EMS for overdose. Reports CPR was performed by fire department. Per pt he overdosed on fentanyl and the "white stuff" denies SI. Pt given  narcan nasally and  by IV, became responsive after the 2nd dose

## 2015-05-28 NOTE — H&P (Signed)
[facility]   PATIENT NAME: Jesse Sampson    MR#:  [id number]  DATE OF BIRTH:  06/29/1995  DATE OF ADMISSION:  05/28/2015  PRIMARY CARE PHYSICIAN: No PCP Per Patient   REQUESTING/REFERRING PHYSICIAN: Dr. Sharma Covert  CHIEF COMPLAINT:   Chief Complaint  Patient presents with  . Drug Overdose    HISTORY OF PRESENT ILLNESS:  Arhum Peeples  is a 20 y.o. male with a known history of Asthma and polysubstance abuse used and shot of IV narcotic. Patient was in his car while he used this. He thought he had IV heroin but this felt like IV fentanyl after which he called his mother. When EMS arrived patient was found to be unresponsive and CPR was started. He was found to be in respiratory arrest and nasal Narcan was used with no response and then IV Narcan was used and patient returned to his baseline. Patient was brought to the emergency room. Here other than a little anxiety patient feels well.  PAST MEDICAL HISTORY:   Past Medical History  Diagnosis Date  . Asthma     PAST SURGICAL HISTORY:  History reviewed. No pertinent past surgical history.  SOCIAL HISTORY:   Social History  Substance Use Topics  . Smoking status: Current Every Day Smoker -- 1.00 packs/day    Types: Cigarettes  . Smokeless tobacco: Not on file  . Alcohol Use: 0.0 oz/week    0 Standard drinks or equivalent per week    FAMILY HISTORY:   Family History  Problem Relation Age of Onset  . Lung cancer Father   . Lung cancer Paternal Grandfather     DRUG ALLERGIES:   Allergies  Allergen Reactions  . Sulfa Antibiotics Rash    REVIEW OF SYSTEMS:   Review of Systems  Constitutional: Negative for fever, chills, weight loss and malaise/fatigue.  HENT: Negative for hearing loss and nosebleeds.   Eyes: Negative for blurred vision, double vision and pain.  Respiratory: Negative for cough, hemoptysis, sputum production, shortness of breath and wheezing.    Cardiovascular: Negative for chest pain, palpitations, orthopnea and leg swelling.  Gastrointestinal: Negative for nausea, vomiting, abdominal pain, diarrhea and constipation.  Genitourinary: Negative for dysuria and hematuria.  Musculoskeletal: Negative for myalgias, back pain and falls.  Skin: Negative for rash.  Neurological: Negative for dizziness, tremors, sensory change, speech change, focal weakness, seizures and headaches.  Endo/Heme/Allergies: Does not bruise/bleed easily.  Psychiatric/Behavioral: Negative for depression and memory loss. The patient is nervous/anxious.     MEDICATIONS AT HOME:   Prior to Admission medications   Medication Sig Start Date End Date Taking? Authorizing Provider  albuterol (PROVENTIL HFA;VENTOLIN HFA) 108 (90 BASE) MCG/ACT inhaler Inhale 2 puffs into the lungs every 6 (six) hours as needed for wheezing or shortness of breath.   Yes Historical Provider, MD     VITAL SIGNS:  Blood pressure 107/70, pulse 100, temperature 98.6 F (37 C), resp. rate 12, height 6' (1.829 m), weight 71.215 kg (157 lb), SpO2 98 %.  PHYSICAL EXAMINATION:  Physical Exam  GENERAL:  20 y.o.-year-old patient lying in the bed with no acute distress. Anxious EYES: Pupils equal, round, reactive to light and accommodation. No scleral icterus. Extraocular muscles intact.  HEENT: Head atraumatic, normocephalic. Oropharynx and nasopharynx clear. No oropharyngeal erythema, moist oral mucosa  NECK:  Supple, no jugular venous distention. No thyroid enlargement, no tenderness.  LUNGS: Normal breath sounds bilaterally, no wheezing, rales, rhonchi. No use of accessory  muscles of respiration.  CARDIOVASCULAR: S1, S2 normal. No murmurs, rubs, or gallops.  ABDOMEN: Soft, nontender, nondistended. Bowel sounds present. No organomegaly or mass.  EXTREMITIES: No pedal edema, cyanosis, or clubbing. + 2 pedal & radial pulses b/l.   NEUROLOGIC: Cranial nerves II through XII are intact. No focal  Motor or sensory deficits appreciated b/l PSYCHIATRIC: The patient is alert and oriented x 3.  SKIN: No obvious rash, lesion, or ulcer.   LABORATORY PANEL:   CBC  Recent Labs Lab 05/28/15 2048  WBC 11.6*  HGB 13.9  HCT 41.1  PLT 260   ------------------------------------------------------------------------------------------------------------------  Chemistries   Recent Labs Lab 05/28/15 2048  NA 143  K 3.5  CL 112*  CO2 22  GLUCOSE 77  BUN 11  CREATININE 0.81  CALCIUM 8.8*  AST 42*  ALT 84*  ALKPHOS 59  BILITOT 0.3   ------------------------------------------------------------------------------------------------------------------  Cardiac Enzymes  Recent Labs Lab 05/28/15 2048  TROPONINI <0.03   ------------------------------------------------------------------------------------------------------------------  RADIOLOGY:  No results found.   IMPRESSION AND PLAN:   * IV narcotic use. Possibly fentanyl. Patient did go through CPR briefly and was given Narcan for respiratory arrest and recovered. At this time he is awake with normal saturations heart rate and blood pressure. Will admit for observation overnight on medical floor with telemetry. Will use continuous pulse oximetry. Counseled regarding not using any illicit drugs.  * Asthma No exacerbation. Nebulizer when necessary.  * Tobacco abuse Nicotine patch placed  * DVT prophylaxis with SCDs  All the records are reviewed and case discussed with ED provider. Management plans discussed with the patient, family and they are in agreement.  CODE STATUS: FULL  TOTAL TIME TAKING CARE OF THIS PATIENT: 40 minutes.   Milagros Loll R M.D on 05/28/2015 at 11:37 PM  Between 7am to 6pm - Pager - [phone]  After 6pm go to [email or web address] - password [email or web address] [facility]  [facility]  Office  [phone]  CC: Primary care physician; No PCP Per Patient  Note: This dictation was prepared with  Dragon dictation along with smaller phrase technology. Any transcriptional errors that result from this process are unintentional.

## 2015-05-28 NOTE — ED Notes (Signed)
MD at bedside. 

## 2015-05-29 MED ORDER — LORAZEPAM 2 MG/ML IJ SOLN: 1.0000 mg | Freq: Four times a day (QID) | INTRAMUSCULAR | Status: DC | PRN

## 2015-05-29 MED ORDER — LORAZEPAM 1 MG PO TABS: 1.0000 mg | ORAL_TABLET | Freq: Four times a day (QID) | ORAL | Status: DC | PRN

## 2015-05-29 MED ORDER — THIAMINE HCL 100 MG/ML IJ SOLN
100.0000 mg | Freq: Every day | INTRAMUSCULAR | Status: DC
Start: 2015-05-29 — End: 2015-05-29

## 2015-05-29 MED ORDER — NICOTINE 21 MG/24HR TD PT24: 21.0000 mg | MEDICATED_PATCH | Freq: Once | TRANSDERMAL | Status: DC

## 2015-05-29 MED ORDER — NICOTINE 21 MG/24HR TD PT24
21.0000 mg | MEDICATED_PATCH | TRANSDERMAL | Status: DC
  Administered 2015-05-29: 21 mg via TRANSDERMAL
  Filled 2015-05-29: qty 1

## 2015-05-29 MED ORDER — ADULT MULTIVITAMIN W/MINERALS CH
1.0000 | ORAL_TABLET | Freq: Every day | ORAL | Status: DC
  Administered 2015-05-29: 1 via ORAL
  Filled 2015-05-29: qty 1

## 2015-05-29 MED ORDER — VITAMIN B-1 100 MG PO TABS
100.0000 mg | ORAL_TABLET | Freq: Every day | ORAL | Status: DC
Start: 2015-05-29 — End: 2015-05-29
  Administered 2015-05-29: 100 mg via ORAL
  Filled 2015-05-29: qty 1

## 2015-05-29 MED ORDER — FOLIC ACID 1 MG PO TABS
1.0000 mg | ORAL_TABLET | Freq: Every day | ORAL | Status: DC
  Administered 2015-05-29: 1 mg via ORAL
  Filled 2015-05-29: qty 1
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
## 2015-05-29 NOTE — Progress Notes (Signed)
Pt discharged ambulatory. Requested  To have nicoderm patch prior to discharged. rn contacted dr mody who allowed rn to place nicotine  patch

## 2017-08-11 ENCOUNTER — Ambulatory Visit
Admission: EM | Admit: 2017-08-11 | Discharge: 2017-08-11 | Disposition: A | Discharge status: Home / Self Care | Payer: BLUE CROSS/BLUE SHIELD | Attending: Family Medicine | Admitting: Family Medicine

## 2017-08-11 ENCOUNTER — Other Ambulatory Visit: Payer: Self-pay

## 2017-08-11 DIAGNOSIS — R197 Diarrhea, unspecified: Secondary | ICD-10-CM

## 2017-08-11 DIAGNOSIS — R110 Nausea: Secondary | ICD-10-CM

## 2017-08-11 LAB — BASIC METABOLIC PANEL
Anion gap: 12 (ref 5–15)
BUN: 9 mg/dL (ref 6–20)
CALCIUM: 9.5 mg/dL (ref 8.9–10.3)
CHLORIDE: 102 mmol/L (ref 101–111)
CO2: 21 mmol/L — ABNORMAL LOW (ref 22–32)
CREATININE: 0.96 mg/dL (ref 0.61–1.24)
Glucose, Bld: 104 mg/dL — ABNORMAL HIGH (ref 65–99)
Potassium: 4 mmol/L (ref 3.5–5.1)
SODIUM: 135 mmol/L (ref 135–145)

## 2017-08-11 LAB — GASTROINTESTINAL PANEL BY PCR, STOOL (REPLACES STOOL CULTURE)
ASTROVIRUS: DETECTED — AB
Adenovirus F40/41: NOT DETECTED
CAMPYLOBACTER SPECIES: NOT DETECTED
CYCLOSPORA CAYETANENSIS: NOT DETECTED
Cryptosporidium: NOT DETECTED
ENTEROTOXIGENIC E COLI (ETEC): NOT DETECTED
Entamoeba histolytica: NOT DETECTED
Enteroaggregative E coli (EAEC): NOT DETECTED
Enteropathogenic E coli (EPEC): NOT DETECTED
Giardia lamblia: NOT DETECTED
NOROVIRUS GI/GII: NOT DETECTED
PLESIMONAS SHIGELLOIDES: NOT DETECTED
ROTAVIRUS A: NOT DETECTED
SAPOVIRUS (I, II, IV, AND V): NOT DETECTED
SHIGA LIKE TOXIN PRODUCING E COLI (STEC): NOT DETECTED
Salmonella species: NOT DETECTED
Shigella/Enteroinvasive E coli (EIEC): NOT DETECTED
Vibrio cholerae: NOT DETECTED
Vibrio species: NOT DETECTED
Yersinia enterocolitica: NOT DETECTED

## 2017-08-11 LAB — C DIFFICILE QUICK SCREEN W PCR REFLEX
C DIFFICILE (CDIFF) INTERP: NOT DETECTED
C DIFFICLE (CDIFF) ANTIGEN: NEGATIVE
C Diff toxin: NEGATIVE

## 2017-08-11 LAB — CBC WITH DIFFERENTIAL/PLATELET
BASOS PCT: 0 %
Basophils Absolute: 0 10*3/uL (ref 0–0.1)
EOS ABS: 0 10*3/uL (ref 0–0.7)
EOS PCT: 0 %
HCT: 50 % (ref 40.0–52.0)
Hemoglobin: 17.3 g/dL (ref 13.0–18.0)
LYMPHS ABS: 1.2 10*3/uL (ref 1.0–3.6)
Lymphocytes Relative: 16 %
MCH: 31.2 pg (ref 26.0–34.0)
MCHC: 34.5 g/dL (ref 32.0–36.0)
MCV: 90.6 fL (ref 80.0–100.0)
MONO ABS: 0.7 10*3/uL (ref 0.2–1.0)
MONOS PCT: 9 %
Neutro Abs: 5.4 10*3/uL (ref 1.4–6.5)
Neutrophils Relative %: 75 %
PLATELETS: 263 10*3/uL (ref 150–440)
RBC: 5.52 MIL/uL (ref 4.40–5.90)
RDW: 12.9 % (ref 11.5–14.5)
WBC: 7.3 10*3/uL (ref 3.8–10.6)

## 2017-08-11 MED ORDER — SODIUM CHLORIDE 0.9 % IV BOLUS
1000.0000 mL | Freq: Once | INTRAVENOUS | Status: AC
  Administered 2017-08-11: 1000 mL via INTRAVENOUS

## 2017-08-11 MED ORDER — ONDANSETRON 4 MG PO TBDP: 4.0000 mg | ORAL_TABLET | Freq: Three times a day (TID) | ORAL | 0 refills | Status: DC | PRN

## 2017-08-11 NOTE — ED Provider Notes (Signed)
[facility] ____________________________________________  Time seen: Approximately 9:58 AM  I have reviewed the triage vital signs and the nursing notes.   HISTORY  Chief Complaint Diarrhea   HPI Jesse Sampson is a 22 y.o. male presenting for evaluation of nausea and diarrhea.  Patient reports diarrhea started Tuesday morning at 2 AM.  Reports on Monday he overall felt well, but states not quite normal.  Denies any known food triggers for current complaints.  Does report his fiance had some similar symptoms this past weekend prior to his, but reports that she was attributing to an antibiotic that she was taking.  States Tuesday and Wednesday he had 15 or 16 episodes of diarrhea each day, states he has gone approximately 6 times so far today.  Reports diarrhea is worse in the melena as well as worse after eating.  Continues to drink fluids well.  Has not taken any over-the-counter medications for the same complaints.  Describes stool as a green-brown color and loose in consistency.  Denies black  or bloody stools or blood in toilet or when wiping.  No vomiting.  Some nausea.  States no abdominal pain, but does have some cramping discomfort just prior to having bowel movement which resolves after bowel movement.  Denies recent sickness.  States has had some chills, no definite fevers.  Denies sore throat, cough, congestion, rash or other complaints. Denies recent sickness. Denies recent antibiotic use.  Denies recent travel.  Labs reviewed and overall unremarkable.  Patient was mildly tachycardic in urgent care, 1 L nasal normal saline given once.   Past Medical History:  Diagnosis Date  . Asthma     Patient Active Problem List   Diagnosis Date Noted  . Overdose 05/28/2015    Past Surgical History:  Procedure Laterality Date  . NO PAST SURGERIES        Current Facility-Administered Medications:  .  sodium chloride 0.9 % bolus 1,000 mL, 1,000 mL, Intravenous, Once,  Renford Dills, NP  Current Outpatient Medications:  .  ondansetron (ZOFRAN ODT) 4 MG disintegrating tablet, Take 1 tablet (4 mg total) by mouth every 8 (eight) hours as needed for nausea or vomiting., Disp: 15 tablet, Rfl: 0  Allergies Sulfa antibiotics  Family History  Problem Relation Age of Onset  . Lung cancer Father   . Lung cancer Paternal Grandfather     Social History Social History   Tobacco Use  . Smoking status: Former Smoker    Types: Cigarettes    Last attempt to quit: 01/11/2017    Years since quitting: 0.5  . Smokeless tobacco: Never Used  Substance Use Topics  . Alcohol use: Yes    Alcohol/week: 0.0 oz    Comment: rarely  . 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 ENT: No sore throat. Cardiovascular: Denies chest pain. Respiratory: Denies shortness of breath. Gastrointestinal: No abdominal pain.  As above. No constipation. Genitourinary: Negative for dysuria. Musculoskeletal: Negative for back pain. Skin: Negative for rash.   ____________________________________________   PHYSICAL EXAM:  VITAL SIGNS: ED Triage Vitals  Enc Vitals Group     BP 08/11/17 0936 106/69     Pulse Rate 08/11/17 0936 (!) 111     Resp 08/11/17 0936 17     Temp 08/11/17 0936 98.8 F (37.1 C)     Temp Source 08/11/17 0936 Oral     SpO2 08/11/17 0936 99 %  Weight 08/11/17 0933 160 lb (72.6 kg)     Height 08/11/17 0933 6' (1.829 m)     Head Circumference --      Peak Flow --      Pain Score 08/11/17 0933 0     Pain Loc --      Pain Edu? --      Excl. in GC? --    Vitals:   08/11/17 0933 08/11/17 0936 08/11/17 1141  BP:  106/69 111/61  Pulse:  (!) 111 62  Resp:  17   Temp:  98.8 F (37.1 C) 98.6 F (37 C)  TempSrc:  Oral Oral  SpO2:  99%   Weight: 160 lb (72.6 kg)    Height: 6' (1.829 m)       Constitutional: Alert and oriented. Well appearing and in no acute  distress. ENT      Head: Normocephalic and atraumatic.      Nose: No congestion/rhinnorhea.      Mouth/Throat: Mucous membranes are moist.Oropharynx non-erythematous. Cardiovascular: Normal rate, regular rhythm. Grossly normal heart sounds.  Good peripheral circulation. Respiratory: Normal respiratory effort without tachypnea nor retractions. Breath sounds are clear and equal bilaterally. No wheezes, rales, rhonchi. Gastrointestinal: Soft and nontender. No distention. Normal Bowel sounds. No CVA tenderness. Musculoskeletal: No midline cervical, thoracic or lumbar tenderness to palpation. Neurologic:  Normal speech and language. No gross focal neurologic deficits are appreciated. Speech is normal. No gait instability.  Skin:  Skin is warm, dry and intact. No rash noted. Psychiatric: Mood and affect are normal. Speech and behavior are normal. Patient exhibits appropriate insight and judgment   ___________________________________________   LABS (all labs ordered are listed, but only abnormal results are displayed)  Labs Reviewed  BASIC METABOLIC PANEL - Abnormal; Notable for the following components:      Result Value   CO2 21 (*)    Glucose, Bld 104 (*)    All other components within normal limits  C DIFFICILE QUICK SCREEN W PCR REFLEX  GASTROINTESTINAL PANEL BY PCR, STOOL (REPLACES STOOL CULTURE)  CBC WITH DIFFERENTIAL/PLATELET     PROCEDURES Procedures    INITIAL IMPRESSION / ASSESSMENT AND PLAN / ED COURSE  Pertinent labs & imaging results that were available during my care of the patient were reviewed by me and considered in my medical decision making (see chart for details).  Well-appearing patient.  No acute distress.  Concern for viral illness.  GI panel as well as C. difficile tests obtained.  Some chills, no definitive fever, no abdominal pain and no bloody stool, discussed will defer use of empiric antibiotics at this time and await GI panel.  Rx PRN Zofran, patient  denies need for Zofran at this time.  Recommend over-the-counter Imodium, fluids, brat diet.  Work note given for today and tomorrow.Discussed indication, risks and benefits of medications with patient.  Discussed follow up with Primary care physician this week. Discussed follow up and return parameters including no resolution or any worsening concerns. Patient verbalized understanding and agreed to plan.   ____________________________________________   FINAL CLINICAL IMPRESSION(S) / ED DIAGNOSES  Final diagnoses:  Diarrhea, unspecified type  Nausea     ED Discharge Orders        Ordered    ondansetron (ZOFRAN ODT) 4 MG disintegrating tablet  Every 8 hours PRN     08/11/17 1113       Note: This dictation was prepared with Dragon dictation along with smaller phrase technology. Any transcriptional errors that result  from this process are unintentional.         Renford Dills, NP 08/11/17 1146

## 2017-08-11 NOTE — Discharge Instructions (Addendum)
Take medication as prescribed. Over the counter imodium as needed. BRAT diet. Rest. Drink plenty of fluids.   Follow up with your primary care physician this week as needed. Return to Urgent care for new or worsening concerns.

## 2017-08-11 NOTE — ED Triage Notes (Signed)
Patient complains of constant diarrhea since Monday, fatigued. Patient states that he has been going 15-16 times daily. Patient denies any abdominal pain.

## 2017-08-15 ENCOUNTER — Telehealth (HOSPITAL_COMMUNITY): Payer: Self-pay

## 2017-08-15 NOTE — Telephone Encounter (Signed)
Contacted patient regarding results. Pt reports feeling better at this time.

## 2017-10-31 ENCOUNTER — Encounter: Payer: Self-pay | Admitting: Emergency Medicine

## 2017-10-31 ENCOUNTER — Other Ambulatory Visit: Payer: Self-pay

## 2017-10-31 ENCOUNTER — Ambulatory Visit
Admission: EM | Admit: 2017-10-31 | Discharge: 2017-10-31 | Disposition: A | Discharge status: Home / Self Care | Payer: BLUE CROSS/BLUE SHIELD | Attending: Family Medicine | Admitting: Family Medicine

## 2017-10-31 DIAGNOSIS — J45901 Unspecified asthma with (acute) exacerbation: Secondary | ICD-10-CM | POA: Diagnosis not present

## 2017-10-31 MED ORDER — PREDNISONE 50 MG PO TABS: ORAL_TABLET | ORAL | 0 refills | Status: DC

## 2017-10-31 MED ORDER — ALBUTEROL SULFATE HFA 108 (90 BASE) MCG/ACT IN AERS: 1.0000 | INHALATION_SPRAY | Freq: Four times a day (QID) | RESPIRATORY_TRACT | 0 refills | Status: DC | PRN

## 2017-10-31 NOTE — ED Triage Notes (Signed)
Patient c/o shortness of breath that started 3 days ago. States he has been vaping for the last 6 months and noticed his SOB worsening so he stopped vaping 2 days. Denies cold symptoms.

## 2017-10-31 NOTE — ED Provider Notes (Signed)
[facility]    CSN: [id number] Arrival date & time: 10/31/17  1746  History   Chief Complaint Chief Complaint  Patient presents with  . Shortness of Breath   HPI  22 year old male with a history of asthma presents with shortness of breath.  Patient reports a 3-day history of shortness of breath.  States that it has been worse since he has been vaping.  He has recently stopped.  He reports that it feels "tight".  He states that he has difficulty catching his breath.  He states that he feels "restricted".  He has been using albuterol with improvement.  He thinks this may be expired.  No reports of wheezing.  No fevers or chills.  No other associated symptoms.  No other complaints.  Past Medical History:  Diagnosis Date  . Asthma    Patient Active Problem List   Diagnosis Date Noted  . Overdose 05/28/2015    Past Surgical History:  Procedure Laterality Date  . NO PAST SURGERIES      Home Medications    Prior to Admission medications   Medication Sig Start Date End Date Taking? Authorizing Provider  albuterol (PROVENTIL HFA;VENTOLIN HFA) 108 (90 Base) MCG/ACT inhaler Inhale 1-2 puffs into the lungs every 6 (six) hours as needed for wheezing or shortness of breath. 10/31/17   Tommie Sams, DO  predniSONE (DELTASONE) 50 MG tablet 1 tablet daily x 5 days. 10/31/17   Tommie Sams, DO    Family History Family History  Problem Relation Age of Onset  . Lung cancer Father   . Lung cancer Paternal Grandfather     Social History Social History   Tobacco Use  . Smoking status: Former Smoker    Types: Cigarettes    Last attempt to quit: 01/11/2017    Years since quitting: 0.8  . Smokeless tobacco: Never Used  Substance Use Topics  . Alcohol use: Yes    Alcohol/week: 0.0 oz    Comment: rarely  . 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   Respiratory: Positive for chest tightness and shortness of breath.    Physical Exam Triage Vital Signs ED Triage Vitals  Enc Vitals Group     BP 10/31/17 1756 107/77     Pulse Rate 10/31/17 1756 74     Resp 10/31/17 1756 16     Temp 10/31/17 1756 99 F (37.2 C)     Temp Source 10/31/17 1756 Oral     SpO2 10/31/17 1756 99 %     Weight 10/31/17 1754 145 lb (65.8 kg)     Height 10/31/17 1754 5\' 11"  (1.803 m)     Head Circumference --      Peak Flow --      Pain Score 10/31/17 1754 0     Pain Loc --      Pain Edu? --      Excl. in GC? --    Updated Vital Signs BP 107/77 (BP Location: Left Arm)   Pulse 74   Temp 99 F (37.2 C) (Oral)   Resp 16   Ht 5\' 11"  (1.803 m)   Wt 145 lb (65.8 kg)   SpO2 99%   BMI 20.22 kg/m   Visual Acuity Right Eye Distance:   Left Eye Distance:   Bilateral Distance:  Right Eye Near:   Left Eye Near:    Bilateral Near:     Physical Exam  Constitutional: He is oriented to person, place, and time. He appears well-developed. No distress.  HENT:  Head: Normocephalic and atraumatic.  Cardiovascular: Normal rate and regular rhythm.  Pulmonary/Chest: Effort normal and breath sounds normal. He has no wheezes. He has no rales.  Neurological: He is alert and oriented to person, place, and time.  Psychiatric: He has a normal mood and affect. His behavior is normal.  Nursing note and vitals reviewed.  UC Treatments / Results  Labs (all labs ordered are listed, but only abnormal results are displayed) Labs Reviewed - No data to display  EKG None  Radiology No results found.  Procedures Procedures (including critical care time)  Medications Ordered in UC Medications - No data to display  Initial Impression / Assessment and Plan / UC Course  I have reviewed the triage vital signs and the nursing notes.  Pertinent labs & imaging results that were available during my care of the patient were reviewed by me  and considered in my medical decision making (see chart for details).    22 year old male presents with shortness of breath.  Appears to be a mild asthma exacerbation.  Treating with prednisone and albuterol.  Final Clinical Impressions(s) / UC Diagnoses   Final diagnoses:  Mild asthma with exacerbation, unspecified whether persistent     Discharge Instructions     Medication as prescribed.  Take care  Dr. Adriana Simasook    ED Prescriptions    Medication Sig Dispense Auth. Provider   predniSONE (DELTASONE) 50 MG tablet 1 tablet daily x 5 days. 5 tablet Marrell Dicaprio G, DO   albuterol (PROVENTIL HFA;VENTOLIN HFA) 108 (90 Base) MCG/ACT inhaler Inhale 1-2 puffs into the lungs every 6 (six) hours as needed for wheezing or shortness of breath. 1 Inhaler Tommie Samsook, Meagon Duskin G, DO     Controlled Substance Prescriptions [place] Controlled Substance Registry consulted? Not Applicable   Tommie SamsCook, Setsuko Robins G, DO 10/31/17 1824

## 2017-10-31 NOTE — Discharge Instructions (Signed)
Medication as prescribed.  Take care  Dr. Tiffiny Worthy  

## 2018-01-30 ENCOUNTER — Other Ambulatory Visit: Payer: Self-pay

## 2018-01-30 ENCOUNTER — Ambulatory Visit
Admission: EM | Admit: 2018-01-30 | Discharge: 2018-01-30 | Disposition: A | Discharge status: Home / Self Care | Payer: BLUE CROSS/BLUE SHIELD | Attending: Family Medicine | Admitting: Family Medicine

## 2018-01-30 ENCOUNTER — Encounter: Payer: Self-pay | Admitting: Emergency Medicine

## 2018-01-30 DIAGNOSIS — J40 Bronchitis, not specified as acute or chronic: Secondary | ICD-10-CM | POA: Diagnosis not present

## 2018-01-30 DIAGNOSIS — J0100 Acute maxillary sinusitis, unspecified: Secondary | ICD-10-CM | POA: Diagnosis not present

## 2018-01-30 MED ORDER — BENZONATATE 100 MG PO CAPS: 100.0000 mg | ORAL_CAPSULE | Freq: Three times a day (TID) | ORAL | 0 refills | Status: DC | PRN

## 2018-01-30 MED ORDER — DOXYCYCLINE HYCLATE 100 MG PO CAPS: 100.0000 mg | ORAL_CAPSULE | Freq: Two times a day (BID) | ORAL | 0 refills | Status: DC

## 2018-01-30 NOTE — ED Triage Notes (Signed)
Patient c/o sinus congestion and drainage and chest congestion that started 7 days ago. Denies fever. Patient has not tried any OTC medications for his symptoms.

## 2018-01-30 NOTE — ED Provider Notes (Signed)
[facility] ____________________________________________  Time seen: Approximately 7:35 PM  I have reviewed the triage vital signs and the nursing notes.   HISTORY  Chief Complaint Nasal Congestion and Cough   HPI Jesse Sampson is a 22 y.o. male presenting for evaluation of runny nose, nasal congestion, cough present for the last 2 weeks, worsened over the last 1 week.  States cough is worse at night and first thing in the morning, and gets better throughout the day.  States some chills, denies known fevers.  Has occasionally take Tylenol, last took this afternoon.  Denies taking other over-the-counter medications for the same complaints.  Reports significant other and child recently sick with some similar complaints.  Continues to eat and drink well.  His continue to remain active.  Denies current sore throat.  States getting a lot of thick yellow-green nasal drainage out as well as with cough.  Denies chest pain or shortness of breath.  Denies other aggravating alleviating factors. Denies recent sickness. Denies recent antibiotic use.    Past Medical History:  Diagnosis Date  . Asthma     Patient Active Problem List   Diagnosis Date Noted  . Overdose 05/28/2015    Past Surgical History:  Procedure Laterality Date  . NO PAST SURGERIES       No current facility-administered medications for this encounter.   Current Outpatient Medications:  .  benzonatate (TESSALON PERLES) 100 MG capsule, Take 1 capsule (100 mg total) by mouth 3 (three) times daily as needed for cough., Disp: 15 capsule, Rfl: 0 .  doxycycline (VIBRAMYCIN) 100 MG capsule, Take 1 capsule (100 mg total) by mouth 2 (two) times daily., Disp: 20 capsule, Rfl: 0  Allergies Sulfa antibiotics  Family History  Problem Relation Age of Onset  . Lung cancer Father   . Lung cancer Paternal Grandfather     Social History Social History   Tobacco Use  . Smoking status: Former Smoker    Types:  Cigarettes    Last attempt to quit: 01/11/2017    Years since quitting: 1.0  . Smokeless tobacco: Current User    Types: Chew  Substance Use Topics  . Alcohol use: Yes    Alcohol/week: 0.0 standard drinks    Comment: rarely  . 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 ENT: as above. Cardiovascular: Denies chest pain. Respiratory: Denies shortness of breath. Gastrointestinal: No abdominal pain.  Musculoskeletal: Negative for back pain. Skin: Negative for rash.  ____________________________________________   PHYSICAL EXAM:  VITAL SIGNS: ED Triage Vitals [01/30/18 1757]  Enc Vitals Group     BP (!) 111/58     Pulse Rate 65     Resp 18     Temp 98.5 F (36.9 C)     Temp Source Oral     SpO2 100 %     Weight 150 lb (68 kg)     Height 5\' 11"  (1.803 m)     Head Circumference      Peak Flow      Pain Score 0     Pain Loc      Pain Edu?      Excl. in GC?     Constitutional: Alert and oriented. Well appearing and in no acute distress. Eyes: Conjunctivae are normal.  Head: Atraumatic.No current tenderness to palpation bilateral frontal and maxillary sinuses. No swelling. No erythema.  Ears: no erythema, normal TMs bilaterally.   Nose: nasal congestion with bilateral nasal turbinate erythema and edema.   Mouth/Throat: Mucous membranes are moist. Oropharynx non-erythematous.No tonsillar swelling or exudate.  Neck: No stridor.  No cervical spine tenderness to palpation. Hematological/Lymphatic/Immunilogical: No cervical lymphadenopathy. Cardiovascular: Normal rate, regular rhythm. Grossly normal heart sounds. Good peripheral circulation. Respiratory: Normal respiratory effort. No retractions. No wheezes, rales or rhonchi. Good air movement.  Musculoskeletal: No cervical, thoracic or lumbar tenderness to palpation.  Neurologic:  Normal speech and language. No  gait instability. Skin:  Skin is warm, dry and intact. No rash noted. Psychiatric: Mood and affect are normal. Speech and behavior are normal.  ___________________________________________   LABS (all labs ordered are listed, but only abnormal results are displayed)  Labs Reviewed - No data to display ____________________________________________   PROCEDURES Procedures    INITIAL IMPRESSION / ASSESSMENT AND PLAN / ED COURSE  Pertinent labs & imaging results that were available during my care of the patient were reviewed by me and considered in my medical decision making (see chart for details).  Well-appearing patient.  No acute distress.  Suspect recent upper respiratory infection with secondary sinusitis and bronchitis.  Will treat with oral doxycycline.  Encourage over-the-counter cough and congestion medication as needed, and PRN Tessalon Perles.  Encourage rest, fluids, supportive care.Discussed indication, risks and benefits of medications with patient.  Discussed follow up and return parameters including no resolution or any worsening concerns. Patient verbalized understanding and agreed to plan.   ____________________________________________   FINAL CLINICAL IMPRESSION(S) / ED DIAGNOSES  Final diagnoses:  Acute maxillary sinusitis, recurrence not specified  Bronchitis     ED Discharge Orders         Ordered    doxycycline (VIBRAMYCIN) 100 MG capsule  2 times daily     01/30/18 1921    benzonatate (TESSALON PERLES) 100 MG capsule  3 times daily PRN     01/30/18 1921           Note: This dictation was prepared with Dragon dictation along with smaller phrase technology. Any transcriptional errors that result from this process are unintentional.         Renford Dills, NP 01/30/18 [phone]

## 2018-01-30 NOTE — Discharge Instructions (Signed)
Take medication as prescribed. Rest. Drink plenty of fluids.  ° °Follow up with your primary care physician this week as needed. Return to Urgent care for new or worsening concerns.  ° °

## 2018-03-31 ENCOUNTER — Other Ambulatory Visit: Payer: Self-pay

## 2018-03-31 ENCOUNTER — Encounter: Payer: Self-pay | Admitting: Emergency Medicine

## 2018-03-31 ENCOUNTER — Ambulatory Visit
Admission: EM | Admit: 2018-03-31 | Discharge: 2018-03-31 | Disposition: A | Discharge status: Home / Self Care | Payer: BLUE CROSS/BLUE SHIELD | Attending: Family Medicine | Admitting: Family Medicine

## 2018-03-31 DIAGNOSIS — R21 Rash and other nonspecific skin eruption: Secondary | ICD-10-CM | POA: Diagnosis not present

## 2018-03-31 MED ORDER — FLUCONAZOLE 150 MG PO TABS: 150.0000 mg | ORAL_TABLET | ORAL | 0 refills | Status: AC

## 2018-03-31 MED ORDER — CLOTRIMAZOLE-BETAMETHASONE 1-0.05 % EX CREA: TOPICAL_CREAM | CUTANEOUS | 0 refills | Status: DC

## 2018-03-31 NOTE — ED Provider Notes (Signed)
[facility]    CSN: [id number] Arrival date & time: 03/31/18  1537     History   Chief Complaint Chief Complaint  Patient presents with  . Rash    HPI Jesse Sampson is a 22 y.o. male.   Subjective:   Jesse Sampson is a 22 y.o. male who presents for evaluation of rash. Rash started 1 week ago. Initial distribution: genitalia, groin, back and right axilla. Lesions are pink in color, are of raised and flat in texture. Rash has changed over time.  Rash is pruritic. Associated symptoms: none. Patient has not had previous evaluation of rash. Patient has not had previous treatment. Patient has not had contacts with similar rash. Patient has not had new exposures.  The following portions of the patient's history were reviewed and updated as appropriate: allergies, current medications, past family history, past medical history, past social history, past surgical history and problem list.        Past Medical History:  Diagnosis Date  . Asthma     Patient Active Problem List   Diagnosis Date Noted  . Overdose 05/28/2015    Past Surgical History:  Procedure Laterality Date  . NO PAST SURGERIES         Home Medications    Prior to Admission medications   Medication Sig Start Date End Date Taking? Authorizing Provider  clotrimazole-betamethasone (LOTRISONE) cream Apply to affected areas 2 times daily for up to two weeks 03/31/18   Lurline Idol, FNP  fluconazole (DIFLUCAN) 150 MG tablet Take 1 tablet (150 mg total) by mouth once a week for 28 days. 03/31/18 04/28/18  Lurline Idol, FNP    Family History Family History  Problem Relation Age of Onset  . Lung cancer Father   . Lung cancer Paternal Grandfather     Social History Social History   Tobacco Use  . Smoking status: Former Smoker    Types: Cigarettes    Last attempt to quit: 01/11/2017    Years since quitting: 1.2  . Smokeless tobacco: Current User    Types: Chew  Substance Use  Topics  . Alcohol use: Yes    Alcohol/week: 0.0 standard drinks    Comment: rarely  . 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   HENT: Negative.   Respiratory: Negative.   Cardiovascular: Negative.   Gastrointestinal: Negative.   Skin: Positive for rash.  Neurological: Negative.   All other systems reviewed and are negative.    Physical Exam Triage Vital Signs ED Triage Vitals  Enc Vitals Group     BP 03/31/18 1549 115/61     Pulse Rate 03/31/18 1549 85     Resp 03/31/18 1549 18     Temp 03/31/18 1549 99.5 F (37.5 C)     Temp Source 03/31/18 1549 Oral     SpO2 03/31/18 1549 97 %     Weight 03/31/18 1548 150 lb (68 kg)     Height 03/31/18 1548 6' (1.829 m)     Head Circumference --      Peak Flow --      Pain Score 03/31/18 1548 0     Pain Loc --      Pain Edu? --      Excl. in GC? --    No data found.  Updated Vital Signs  BP 115/61 (BP Location: Right Arm)   Pulse 85   Temp 99.5 F (37.5 C) (Oral)   Resp 18   Ht 6' (1.829 m)   Wt 150 lb (68 kg)   SpO2 97%   BMI 20.34 kg/m   Visual Acuity Right Eye Distance:   Left Eye Distance:   Bilateral Distance:    Right Eye Near:   Left Eye Near:    Bilateral Near:     Physical Exam Constitutional:      Appearance: Normal appearance.  HENT:     Head: Normocephalic.  Neck:     Musculoskeletal: Normal range of motion and neck supple.  Cardiovascular:     Rate and Rhythm: Normal rate and regular rhythm.  Pulmonary:     Effort: Pulmonary effort is normal.  Musculoskeletal: Normal range of motion.  Skin:    General: Skin is warm and dry.     Findings: Rash present.     Comments: Multiple mildly erythematous thin plaques to the back. Papular skin lesions noted to the left groin region, shaft of penis and right axilla.     Neurological:     General: No focal  deficit present.     Mental Status: He is alert and oriented to person, place, and time.  Psychiatric:        Mood and Affect: Mood normal.        Behavior: Behavior normal.      UC Treatments / Results  Labs (all labs ordered are listed, but only abnormal results are displayed) Labs Reviewed - No data to display  EKG None  Radiology No results found.  Procedures Procedures (including critical care time)  Medications Ordered in UC Medications - No data to display  Initial Impression / Assessment and Plan / UC Course  I have reviewed the triage vital signs and the nursing notes.  Pertinent labs & imaging results that were available during my care of the patient were reviewed by me and considered in my medical decision making (see chart for details).     22 year old male diffuse rash to the shaft of the penis, right groin, back and right axilla.  Rash has been there for a week and has progressively worsened.  It is pruritic.  Patient denies any associating symptoms.  No penile pain/discharge.  No urinary symptoms.  No STI exposures.  Patient is alert and oriented x3.  Afebrile.  Vital signs stable.  Nontoxic-appearing.  Unclear etiology of rash.  Does have a suspected fungal component. Trial of Lotrisone to affected areas twice a day x 2 weeks. Diflucan 150 mg PO once a week for 4 weeks. Benadryl prn for itching. Referral to Dermatology if unimproved.  Today's evaluation has revealed no signs of a dangerous process. Discussed diagnosis with patient. Patient aware of their diagnosis, possible red flag symptoms to watch out for and need for close follow up. Patient understands verbal and written discharge instructions. Patient comfortable with plan and disposition.  Patient has a clear mental status at this time, good insight into illness (after discussion and teaching) and has clear judgment to make decisions regarding their care.  Documentation was completed with the aid of voice  recognition software. Transcription may contain typographical errors.  Final Clinical Impressions(s) / UC Diagnoses   Final diagnoses:  Rash and nonspecific skin eruption     Discharge Instructions     Take medications as prescribed. Follow-up with dermatology if no improvement or worsening of symptoms.  ED Prescriptions    Medication Sig Dispense Auth. Provider   clotrimazole-betamethasone (LOTRISONE) cream Apply to affected areas 2 times daily for up to two weeks 15 g Lurline IdolMurrill, Stephens Shreve, FNP   fluconazole (DIFLUCAN) 150 MG tablet Take 1 tablet (150 mg total) by mouth once a week for 28 days. 4 tablet Lurline IdolMurrill, Camil Hausmann, FNP     Controlled Substance Prescriptions [place] Controlled Substance Registry consulted? Not Applicable   Lurline IdolMurrill, Vasily Fedewa, FNP 03/31/18 1714

## 2018-03-31 NOTE — Discharge Instructions (Addendum)
Take medications as prescribed. Follow-up with dermatology if no improvement or worsening of symptoms.

## 2018-03-31 NOTE — ED Triage Notes (Signed)
Patient c/o rash to his groin area x 1 week. He says now the rash has gone to under his right arm and right thigh area. Patient does report that the rash is itchy.

## 2018-04-24 ENCOUNTER — Ambulatory Visit
Admission: EM | Admit: 2018-04-24 | Discharge: 2018-04-24 | Disposition: A | Discharge status: Home / Self Care | Payer: BLUE CROSS/BLUE SHIELD | Attending: Family Medicine | Admitting: Family Medicine

## 2018-04-24 ENCOUNTER — Other Ambulatory Visit: Payer: Self-pay

## 2018-04-24 DIAGNOSIS — J069 Acute upper respiratory infection, unspecified: Secondary | ICD-10-CM

## 2018-04-24 MED ORDER — OSELTAMIVIR PHOSPHATE 75 MG PO CAPS: 75.0000 mg | ORAL_CAPSULE | Freq: Every day | ORAL | 0 refills | Status: DC

## 2018-04-24 NOTE — ED Triage Notes (Signed)
Cough and runny nose x past few days. Unsure if fever. Pt reports his fiance was diagnosed with FLU A yesterday

## 2018-04-24 NOTE — Discharge Instructions (Signed)
Rest, fluids, tylenol/adviil as needed

## 2018-04-24 NOTE — ED Provider Notes (Signed)
[facility]    CSN: [id number] Arrival date & time: 04/24/18  0920     History   Chief Complaint Chief Complaint  Patient presents with  . Cough  . Nasal Congestion  . Appointment    HPI Jesse Sampson is a 23 y.o. male.   The history is provided by the patient.  Cough  Associated symptoms: rhinorrhea   Associated symptoms: no wheezing   URI  Presenting symptoms: congestion, cough and rhinorrhea   Severity:  Moderate Onset quality:  Sudden Duration:  2 days Timing:  Constant Progression:  Unchanged Chronicity:  New Relieved by:  None tried Ineffective treatments:  None tried Associated symptoms: no sinus pain and no wheezing   Risk factors: sick contacts (fiancee with flu)   Risk factors: not elderly and no chronic cardiac disease     Past Medical History:  Diagnosis Date  . Asthma     Patient Active Problem List   Diagnosis Date Noted  . Overdose 05/28/2015    Past Surgical History:  Procedure Laterality Date  . NO PAST SURGERIES         Home Medications    Prior to Admission medications   Medication Sig Start Date End Date Taking? Authorizing Provider  clotrimazole-betamethasone (LOTRISONE) cream Apply to affected areas 2 times daily for up to two weeks 03/31/18   Lurline Idol, FNP  fluconazole (DIFLUCAN) 150 MG tablet Take 1 tablet (150 mg total) by mouth once a week for 28 days. 03/31/18 04/28/18  Lurline Idol, FNP  oseltamivir (TAMIFLU) 75 MG capsule Take 1 capsule (75 mg total) by mouth daily. 04/24/18   Payton Mccallum, MD    Family History Family History  Problem Relation Age of Onset  . Lung cancer Father   . Lung cancer Paternal Grandfather     Social History Social History   Tobacco Use  . Smoking status: Former Smoker    Packs/day: 1.00    Types: Cigarettes  . Smokeless tobacco: Current User    Types: Chew  Substance Use Topics  . Alcohol use: Yes    Alcohol/week: 0.0 standard drinks    Comment: rarely   . 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 Negative for sinus pain.   Respiratory: Positive for cough. Negative for wheezing.      Physical Exam Triage Vital Signs ED Triage Vitals  Enc Vitals Group     BP 04/24/18 0933 114/74     Pulse Rate 04/24/18 0933 92     Resp 04/24/18 0933 16     Temp 04/24/18 0933 98.7 F (37.1 C)     Temp Source 04/24/18 0933 Oral     SpO2 04/24/18 0933 98 %     Weight 04/24/18 0935 150 lb (68 kg)     Height 04/24/18 0935 5\' 11"  (1.803 m)     Head Circumference --      Peak Flow --      Pain Score 04/24/18 0934 0     Pain Loc --      Pain Edu? --      Excl. in GC? --    No data found.  Updated Vital Signs BP 114/74 (BP Location: Right Arm)   Pulse 92   Temp 98.7 F (37.1 C) (Oral)   Resp 16  Ht 5\' 11"  (1.803 m)   Wt 68 kg   SpO2 98%   BMI 20.92 kg/m   Visual Acuity Right Eye Distance:   Left Eye Distance:   Bilateral Distance:    Right Eye Near:   Left Eye Near:    Bilateral Near:     Physical Exam Vitals signs and nursing note reviewed.  Constitutional:      General: He is not in acute distress.    Appearance: He is well-developed. He is not toxic-appearing or diaphoretic.  HENT:     Head: Normocephalic and atraumatic.     Right Ear: Tympanic membrane, ear canal and external ear normal.     Left Ear: Tympanic membrane, ear canal and external ear normal.     Nose: Nose normal.     Mouth/Throat:     Pharynx: Uvula midline. No oropharyngeal exudate.     Tonsils: No tonsillar abscesses.  Eyes:     General: No scleral icterus.       Right eye: No discharge.        Left eye: No discharge.  Neck:     Musculoskeletal: Normal range of motion and neck supple.     Thyroid: No thyromegaly.     Trachea: No tracheal deviation.  Cardiovascular:     Rate and Rhythm:  Normal rate and regular rhythm.     Heart sounds: Normal heart sounds.  Pulmonary:     Effort: Pulmonary effort is normal. No respiratory distress.     Breath sounds: Normal breath sounds. No stridor. No wheezing, rhonchi or rales.  Chest:     Chest wall: No tenderness.  Lymphadenopathy:     Cervical: No cervical adenopathy.  Skin:    General: Skin is warm and dry.     Findings: No rash.  Neurological:     Mental Status: He is alert.      UC Treatments / Results  Labs (all labs ordered are listed, but only abnormal results are displayed) Labs Reviewed - No data to display  EKG None  Radiology No results found.  Procedures Procedures (including critical care time)  Medications Ordered in UC Medications - No data to display  Initial Impression / Assessment and Plan / UC Course  I have reviewed the triage vital signs and the nursing notes.  Pertinent labs & imaging results that were available during my care of the patient were reviewed by me and considered in my medical decision making (see chart for details).      Final Clinical Impressions(s) / UC Diagnoses   Final diagnoses:  Viral URI     Discharge Instructions     Rest, fluids, tylenol/adviil as needed    ED Prescriptions    Medication Sig Dispense Auth. Provider   oseltamivir (TAMIFLU) 75 MG capsule Take 1 capsule (75 mg total) by mouth daily. 7 capsule Payton Mccallumonty, Chalyn Amescua, MD      1. diagnosis reviewed with patient 2. rx as per orders above; reviewed possible side effects, interactions, risks and benefits  3. Recommend supportive treatment as above  4. Follow-up prn if symptoms worsen or don't improve  Controlled Substance Prescriptions [place] Controlled Substance Registry consulted? Not Applicable   Payton Mccallumonty, Carvell Hoeffner, MD 04/24/18 1026

## 2019-01-23 ENCOUNTER — Other Ambulatory Visit: Payer: Self-pay | Admitting: *Deleted

## 2019-01-23 DIAGNOSIS — Z20822 Contact with and (suspected) exposure to covid-19: Secondary | ICD-10-CM

## 2019-01-25 LAB — NOVEL CORONAVIRUS, NAA: SARS-CoV-2, NAA: NOT DETECTED

## 2019-01-29 ENCOUNTER — Telehealth: Payer: Self-pay | Admitting: Hematology

## 2019-01-29 NOTE — Telephone Encounter (Signed)
Pt is aware covid 19 test is neg  

## 2019-02-16 ENCOUNTER — Other Ambulatory Visit: Payer: Self-pay

## 2019-02-16 ENCOUNTER — Encounter: Payer: Self-pay | Admitting: Emergency Medicine

## 2019-02-16 ENCOUNTER — Ambulatory Visit
Admission: EM | Admit: 2019-02-16 | Discharge: 2019-02-16 | Disposition: A | Discharge status: Home / Self Care | Payer: BC Managed Care – PPO | Attending: Emergency Medicine | Admitting: Emergency Medicine

## 2019-02-16 DIAGNOSIS — Z20828 Contact with and (suspected) exposure to other viral communicable diseases: Secondary | ICD-10-CM

## 2019-02-16 DIAGNOSIS — F17220 Nicotine dependence, chewing tobacco, uncomplicated: Secondary | ICD-10-CM | POA: Diagnosis not present

## 2019-02-16 DIAGNOSIS — Z20822 Contact with and (suspected) exposure to covid-19: Secondary | ICD-10-CM

## 2019-02-16 NOTE — Discharge Instructions (Addendum)
Self-isolation until your Covid test is resulted.  Here is a list of primary care providers who are taking new patients:  Dr. Otilio Miu, Dr. Adline Potter [address] [place] [address] [place] [address]  [phone]  [facility] [address] [place], [place] [address] [phone]  [facility] [place]  [phone] [place], [place] [address]  Here are clinics/ other resources who will see you if you do not have insurance. Some have certain criteria that you must meet. Call them and find out what they are:  [facility]: [address] [place], [place] [address] Phone: [phone] Hours: First and Third Saturdays of each Month, 9 a.m. - 1 p.m.  [facility]: [address], [place], [place], [place] [address] Phone: [phone] Hours: Tuesday, 4 p.m. - 8 p.m. Thursday, 1 p.m. - 8 p.m. Wednesday, 9 a.m. - [facility] [address], [place], [place] [address] Phone: [phone] Pharmacy Phone Number: [phone] Dental Phone Number: [phone] [place] Help: [phone]  Dental Hours: Monday - Thursday, 8 a.m. - 6 p.m.  [facility] [address], [place], [place] [address] Phone: [phone] Pharmacy Phone Number: [phone] [facility] Insurance Help: [phone]  [facility] [place] [address] [address] [place], [place] [address] Phone: [phone] Pharmacy Phone Number: [phone] [facility] Insurance Help: [phone]  [facility] [address] [place], [place] [address] Phone: [phone] [facility] Insurance Help: [phone]   [address] [place], [place] [address] Phone: [phone]  Go to www.goodrx.com to look up your medications. This will give you a list of where you can find your prescriptions at the most affordable  prices. Or ask the pharmacist what the cash price is, or if they have any other discount programs available to help make your medication more affordable. This can be less expensive than what you would pay with insurance.

## 2019-02-16 NOTE — ED Provider Notes (Signed)
HPI  SUBJECTIVE:  Jesse Sampson is a 23 y.o. male who presents for Covid testing.  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  He states that he needs a test before he can return to work.  He denies fevers, bodies, headaches, nasal congestion, rhinorrhea, loss of sense of smell or taste, sore throat, nausea, vomiting, diarrhea, abdominal pain, cough, shortness of breath.  Past medical history of asthma as a child.  No history of diabetes, immunocompromise, chronic kidney disease, HIV, cancer.  PMD: None.    Past Medical History:  Diagnosis Date  . Asthma     Past Surgical History:  Procedure Laterality Date  . NO PAST SURGERIES      Family History  Problem Relation Age of Onset  . Lung cancer Father   . Lung cancer Paternal Grandfather     Social History   Tobacco Use  . Smoking status: Former Smoker    Packs/day: 1.00    Types: Cigarettes  . Smokeless tobacco: Current User    Types: Chew  Substance Use Topics  . Alcohol use: Yes    Alcohol/week: 0.0 standard drinks    Comment: rarely  . 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  No current outpatient medications on file.  Allergies  Allergen Reactions  . Sulfa Antibiotics Rash     ROS  As noted in HPI.   Physical Exam  BP 115/73 (BP Location: Left Arm)   Pulse (!) 103   Temp 98.6 F (37 C) (Oral)   Resp 16   Ht 6' (1.829 m)   Wt 74.8 kg   SpO2 100%   BMI 22.38 kg/m   Constitutional: Well developed, well nourished, no acute distress Eyes:  EOMI, conjunctiva normal bilaterally HENT: Normocephalic, atraumatic,mucus membranes moist Respiratory: Normal inspiratory effort, good air movement.  Lungs clear bilaterally Cardiovascular: Normal rate, regular rhythm no murmurs rubs or gallops GI: nondistended skin: No rash, skin intact Musculoskeletal: no  deformities Neurologic: Alert & oriented x 3, no focal neuro deficits Psychiatric: Speech and behavior appropriate   ED Course   Medications - No data to display  Orders Placed This Encounter  Procedures  . Novel Coronavirus, NAA (Hosp order, Send-out to Ref Lab; TAT 18-24 hrs    Standing Status:   Standing    Number of Occurrences:   1    Order Specific Question:   Is this test for diagnosis or screening    Answer:   Screening    Order Specific Question:   Symptomatic for COVID-19 as defined by CDC    Answer:   No    Order Specific Question:   Hospitalized for COVID-19    Answer:   No    Order Specific Question:   Admitted to ICU for COVID-19    Answer:   No    Order Specific Question:   Previously tested for COVID-19    Answer:   No    Order Specific Question:   Resident in a congregate (group) care setting    Answer:   No    Order Specific Question:   Employed in healthcare setting    Answer:   No    No results found for this or any previous visit (from the past 24 hour(s)). No results found.  ED Clinical Impression  1. Encounter for laboratory testing for COVID-19 virus  ED Assessment/Plan  Covid test sent.  Advised isolation until Covid test is resulted.  Will write work note.  Providing primary care list for routine care.    No orders of the defined types were placed in this encounter.   *This clinic note was created using Dragon dictation software. Therefore, there may be occasional mistakes despite careful proofreading.   ?    Melynda Ripple, MD 02/16/19 1827

## 2019-02-16 NOTE — ED Triage Notes (Signed)
Patient states that his son tested positive for COVID 18 days ago.  Patient states that he needs a note and a COVID test in order to go back to work.  Patient denies any symptoms or fever.

## 2019-02-18 LAB — NOVEL CORONAVIRUS, NAA (HOSP ORDER, SEND-OUT TO REF LAB; TAT 18-24 HRS): SARS-CoV-2, NAA: NOT DETECTED

## 2019-06-22 ENCOUNTER — Emergency Department
Admission: EM | Admit: 2019-06-22 | Discharge: 2019-06-22 | Disposition: A | Discharge status: Home / Self Care | Payer: BC Managed Care – PPO | Attending: Student in an Organized Health Care Education/Training Program | Admitting: Student in an Organized Health Care Education/Training Program

## 2019-06-22 ENCOUNTER — Emergency Department: Payer: BC Managed Care – PPO

## 2019-06-22 ENCOUNTER — Other Ambulatory Visit: Payer: Self-pay

## 2019-06-22 DIAGNOSIS — R519 Headache, unspecified: Secondary | ICD-10-CM | POA: Diagnosis not present

## 2019-06-22 DIAGNOSIS — Z87891 Personal history of nicotine dependence: Secondary | ICD-10-CM | POA: Diagnosis not present

## 2019-06-22 DIAGNOSIS — J45909 Unspecified asthma, uncomplicated: Secondary | ICD-10-CM | POA: Insufficient documentation

## 2019-06-22 MED ORDER — ONDANSETRON 4 MG PO TBDP
4.0000 mg | ORAL_TABLET | Freq: Once | ORAL | Status: AC
  Administered 2019-06-22: 4 mg via ORAL
  Filled 2019-06-22: qty 1

## 2019-06-22 MED ORDER — KETOROLAC TROMETHAMINE 10 MG PO TABS
10.0000 mg | ORAL_TABLET | Freq: Four times a day (QID) | ORAL | 0 refills | Status: AC | PRN
Start: 2019-06-22 — End: ?

## 2019-06-22 MED ORDER — KETOROLAC TROMETHAMINE 30 MG/ML IJ SOLN
30.0000 mg | Freq: Once | INTRAMUSCULAR | Status: AC
  Administered 2019-06-22: 30 mg via INTRAMUSCULAR
  Filled 2019-06-22: qty 1

## 2019-06-22 NOTE — ED Triage Notes (Signed)
Pt states is taking 1000mg  of tylenol every 3-6 hours. Pt states "i'm eating it like candy".

## 2019-06-22 NOTE — ED Triage Notes (Signed)
Pt states headache off and on for two weeks with photophobia and sensitivity to sound. Pt states when he does have the headache he is nauseated. Pt denies fever. Pt appears in no acute distress.

## 2019-06-22 NOTE — ED Provider Notes (Signed)
[facility] [place] Emergency Department Provider Note  ____________________________________________  Time seen: Approximately 9:12 PM  I have reviewed the triage vital signs and the nursing notes.   HISTORY  Chief Complaint Headache    HPI Jesse Sampson is a 24 y.o. male that presents to the emergency department for evaluation of headache and photophobia for 2 weeks.  Headache wraps around his head.  He is occasionally dizzy.  It seems to happen at the same time every day morning afternoon and dinner.  Patient had migraines as a child but not had them for years. His grandfather had cluster headaches.  He has taken Tylenol, Motrin.    Past Medical History:  Diagnosis Date  . Asthma     Patient Active Problem List   Diagnosis Date Noted  . Overdose 05/28/2015    Past Surgical History:  Procedure Laterality Date  . NO PAST SURGERIES      Prior to Admission medications   Medication Sig Start Date End Date Taking? Authorizing Provider  ketorolac (TORADOL) 10 MG tablet Take 1 tablet (10 mg total) by mouth every 6 (six) hours as needed. 06/22/19   Laban Emperor, PA-C    Allergies Sulfa antibiotics  Family History  Problem Relation Age of Onset  . Lung cancer Father   . Lung cancer Paternal Grandfather     Social History Social History   Tobacco Use  . Smoking status: Former Smoker    Packs/day: 1.00    Types: Cigarettes  . Smokeless tobacco: Current User    Types: Chew  Substance Use Topics  . Alcohol use: Yes    Alcohol/week: 0.0 standard drinks    Comment: rarely  . 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 Respiratory: No SOB. Gastrointestinal: No abdominal pain.  No nausea, no vomiting.  Musculoskeletal: Negative for musculoskeletal pain. Skin: Negative for rash, abrasions, lacerations,  ecchymosis. Neurological: Negative for numbness or tingling. Positive for headache.   ____________________________________________   PHYSICAL EXAM:  VITAL SIGNS: ED Triage Vitals  Enc Vitals Group     BP 06/22/19 2016 132/72     Pulse Rate 06/22/19 2016 88     Resp 06/22/19 2016 14     Temp 06/22/19 2016 99 F (37.2 C)     Temp Source 06/22/19 2016 Oral     SpO2 06/22/19 2016 100 %     Weight 06/22/19 2015 150 lb (68 kg)     Height 06/22/19 2015 6' (1.829 m)     Head Circumference --      Peak Flow --      Pain Score 06/22/19 2015 4     Pain Loc --      Pain Edu? --      Excl. in [place]? --      Constitutional: Alert and oriented. Well appearing and in no acute distress. Eyes: Conjunctivae are normal. PERRL. EOMI. Head: Atraumatic. ENT:      Ears:      Nose: No congestion/rhinnorhea.      Mouth/Throat: Mucous membranes are moist.  Neck: No stridor.   Cardiovascular: Normal rate, regular rhythm.  Good peripheral circulation. Respiratory: Normal respiratory effort without tachypnea or retractions. Lungs CTAB. Good air entry to the bases with no decreased or absent breath sounds. Gastrointestinal: Bowel sounds 4 quadrants. Soft and nontender to palpation. No guarding or rigidity. No palpable masses.  No distention.  Musculoskeletal: Full range of motion to all extremities. No gross deformities appreciated. Neurologic:  Normal speech and language. No gross focal neurologic deficits are appreciated.  Skin:  Skin is warm, dry and intact. No rash noted. Psychiatric: Mood and affect are normal. Speech and behavior are normal. Patient exhibits appropriate insight and judgement.   ____________________________________________   LABS (all labs ordered are listed, but only abnormal results are displayed)  Labs Reviewed - No data to display ____________________________________________  EKG   ____________________________________________  RADIOLOGY   CT Head Wo  Contrast  Result Date: 06/22/2019 CLINICAL DATA:  Headache EXAM: CT HEAD WITHOUT CONTRAST TECHNIQUE: Contiguous axial images were obtained from the base of the skull through the vertex without intravenous contrast. COMPARISON:  None. FINDINGS: Brain: No acute intracranial abnormality. Specifically, no hemorrhage, hydrocephalus, mass lesion, acute infarction, or significant intracranial injury. Vascular: No hyperdense vessel or unexpected calcification. Skull: No acute calvarial abnormality. Sinuses/Orbits: Visualized paranasal sinuses and mastoids clear. Orbital soft tissues unremarkable. Other: None IMPRESSION: Normal study. Electronically Signed   By: Charlett Nose M.D.   On: 06/22/2019 21:56    ____________________________________________    PROCEDURES  Procedure(s) performed:    Procedures    Medications  ketorolac (TORADOL) 30 MG/ML injection 30 mg (30 mg Intramuscular Given 06/22/19 2248)  ondansetron (ZOFRAN-ODT) disintegrating tablet 4 mg (4 mg Oral Given 06/22/19 2248)     ____________________________________________   INITIAL IMPRESSION / ASSESSMENT AND PLAN / ED COURSE  Pertinent labs & imaging results that were available during my care of the patient were reviewed by me and considered in my medical decision making (see chart for details).  Review of the [place] CSRS was performed in accordance of the [facility] prior to dispensing any controlled drugs.    Patient presented to emergency department for evaluation of headache.  Vital signs and exam are reassuring.  CT scan negative for acute abnormalities.  Patient was given IM Toradol and Zofran for pain.  Headache significantly improved after medications.  Patient will be discharged home with prescriptions for Toradol. Patient is to follow up with primary care as directed. Patient is given ED precautions to return to the ED for any worsening or new symptoms.   Jesse Sampson was evaluated in Emergency Department on 06/23/2019 for the  symptoms described in the history of present illness. He was evaluated in the context of the global COVID-19 pandemic, which necessitated consideration that the patient might be at risk for infection with the SARS-CoV-2 virus that causes COVID-19. Institutional protocols and algorithms that pertain to the evaluation of patients at risk for COVID-19 are in a state of rapid change based on information released by regulatory bodies including the CDC and federal and state organizations. These policies and algorithms were followed during the patient's care in the ED.  ____________________________________________  FINAL CLINICAL IMPRESSION(S) / ED DIAGNOSES  Final diagnoses:  Acute nonintractable headache, unspecified headache type      NEW MEDICATIONS STARTED DURING THIS VISIT:  ED Discharge Orders         Ordered    ketorolac (TORADOL) 10 MG tablet  Every 6 hours PRN     06/22/19 2324              This chart was dictated using voice recognition software/Dragon. Despite best efforts to proofread, errors can occur which can change the meaning. Any change was purely unintentional.    Enid Derry, PA-C 06/23/19 0002    Willy Eddy, MD 06/23/19 2059

## 2019-06-22 NOTE — Discharge Instructions (Signed)
Do not take any additional ibuprofen with the toradol.

## 2019-06-22 NOTE — ED Notes (Signed)
Pt to the er for headache. Pt says he looked it up and it said seek medical attention. Pt says he was having a headache for 17 days since his covid shot. Pt is taking OTC meds at home.

## 2020-10-16 IMAGING — CT CT HEAD W/O CM
3 series · 15 of 47 positions shown, 18 images · non-contrast
Comparison: None.

CLINICAL DATA: Headache

EXAM:
CT HEAD WITHOUT CONTRAST
TECHNIQUE: Contiguous axial images were obtained from the base of the skull
through the vertex without intravenous contrast.

[Series 2: head wo · axial · 0.43mm/px · z∈[+415,+540]mm · 9 of 30 slices shown, 12 images]
[im 3/30  brain]
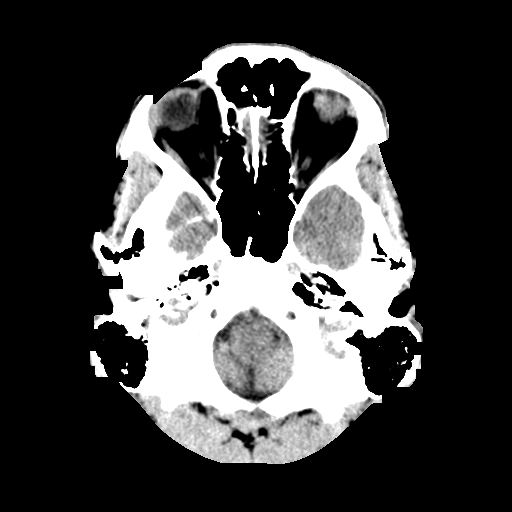
[im 3/30  bone]
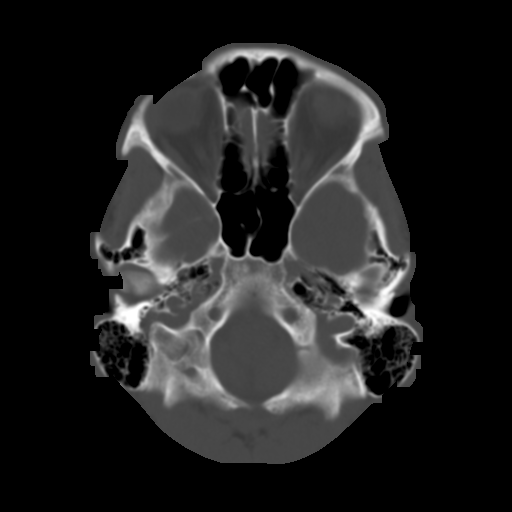
[im 6/30  brain]
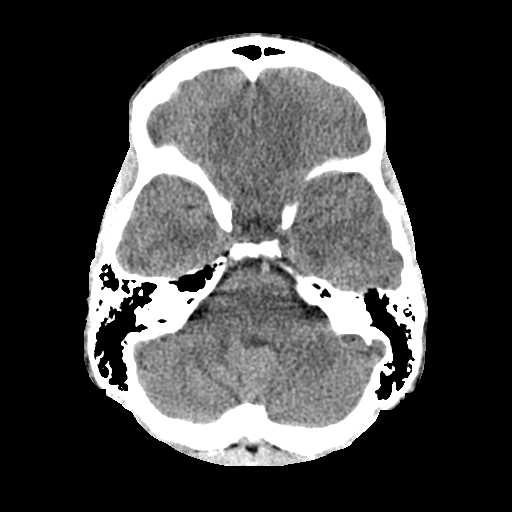
[im 9/30  brain]
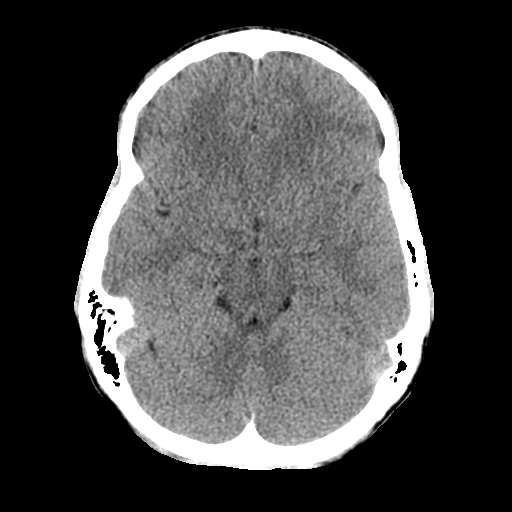
[im 12/30  brain]
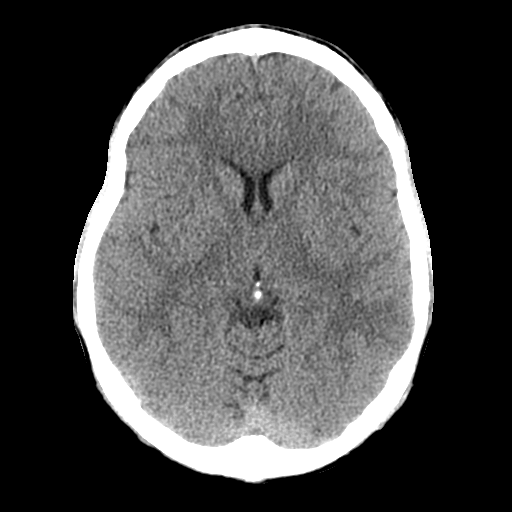
[im 16/30  brain]
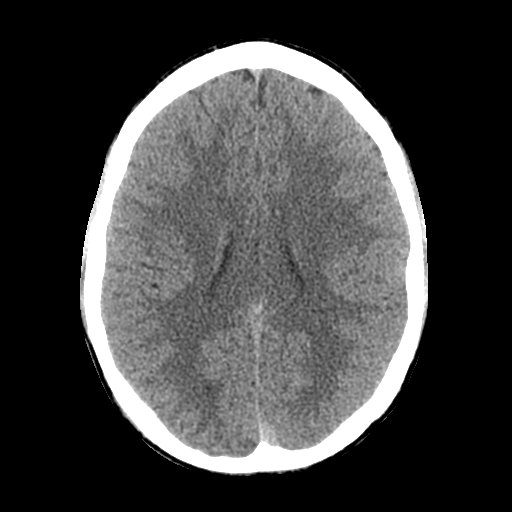
[im 16/30  bone]
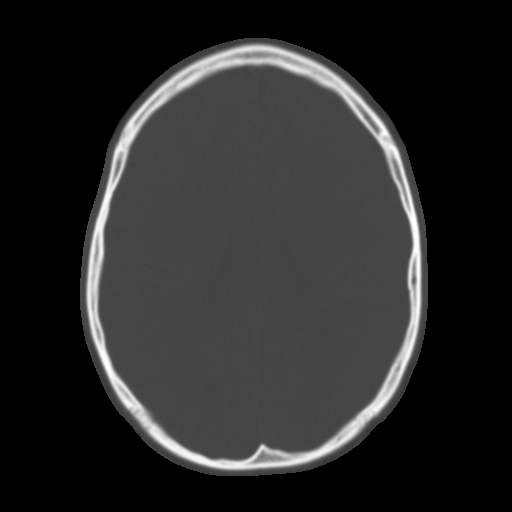
[im 19/30  brain]
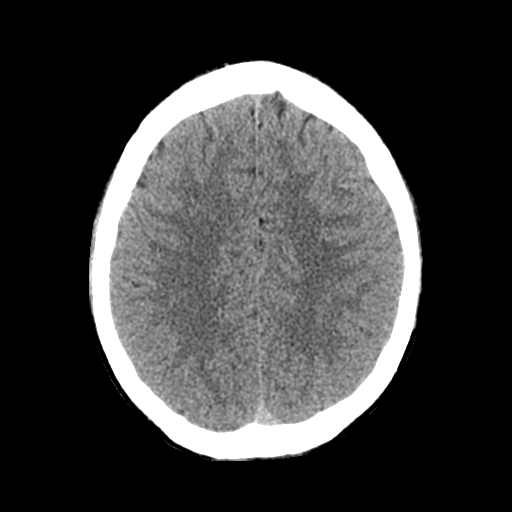
[im 22/30  brain]
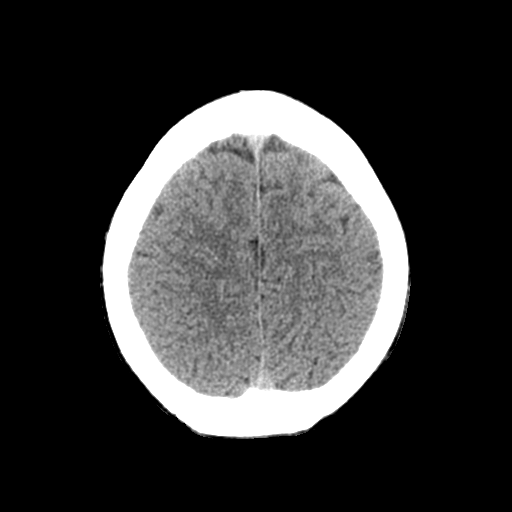
[im 25/30  brain]
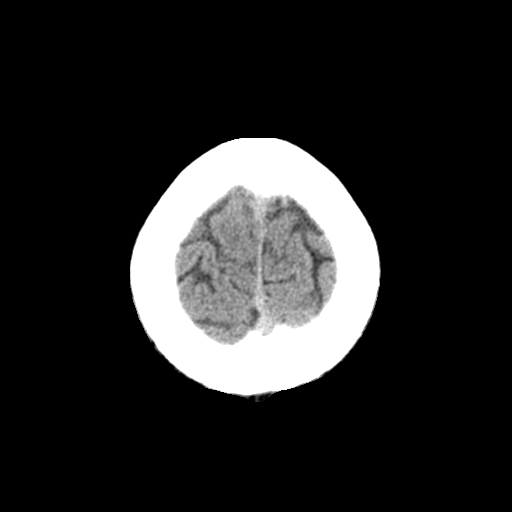
[im 28/30  brain]
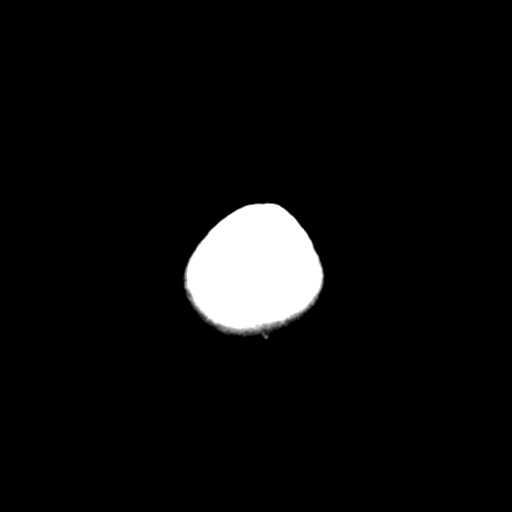
[im 28/30  bone]
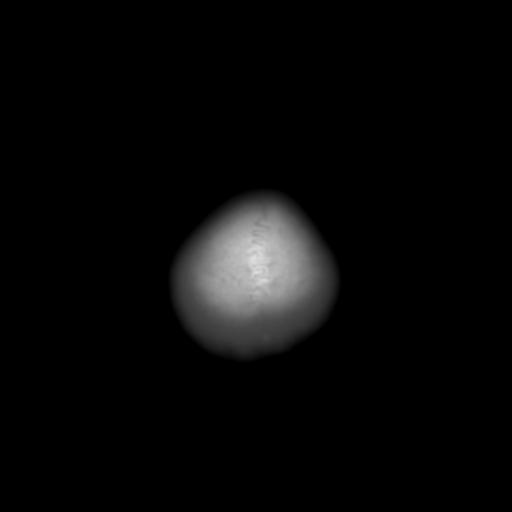

[Series 4: coronal soft tissue · coronal · 0.30mm/px · 3 of 66 slices shown]
[im 22/66  brain]
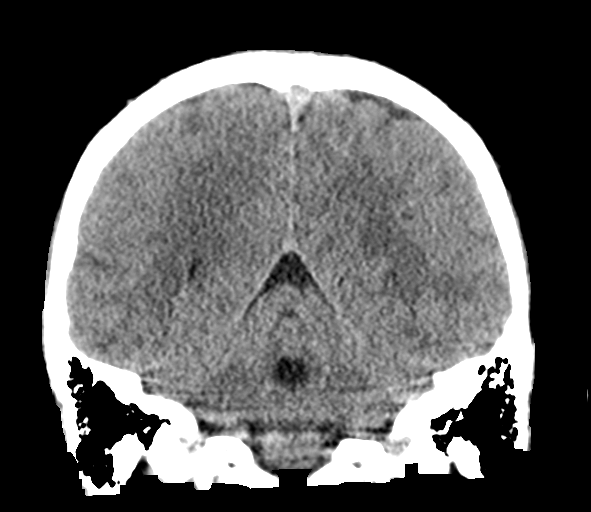
[im 29/66  brain]
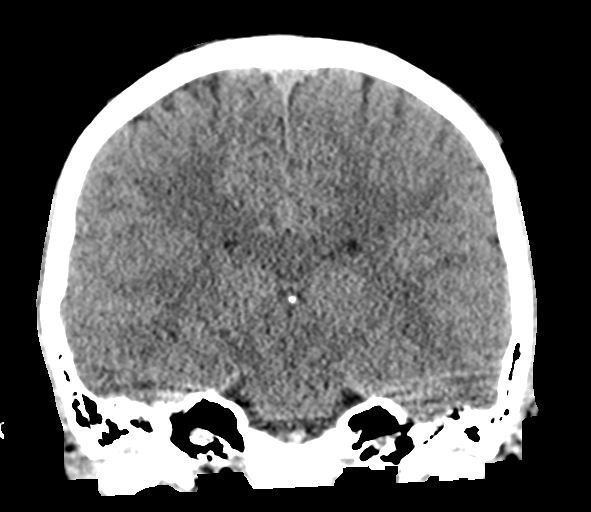
[im 37/66  brain]
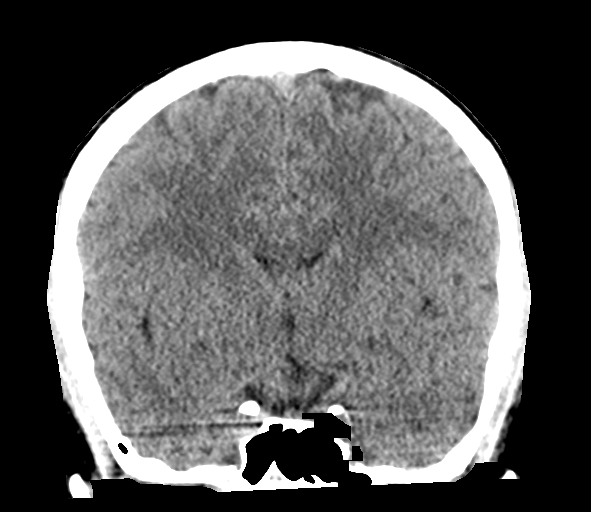

[Series 5: sagittal soft tissue · sagittal · 0.32mm/px · 3 of 56 slices shown]
[im 19/56  brain]
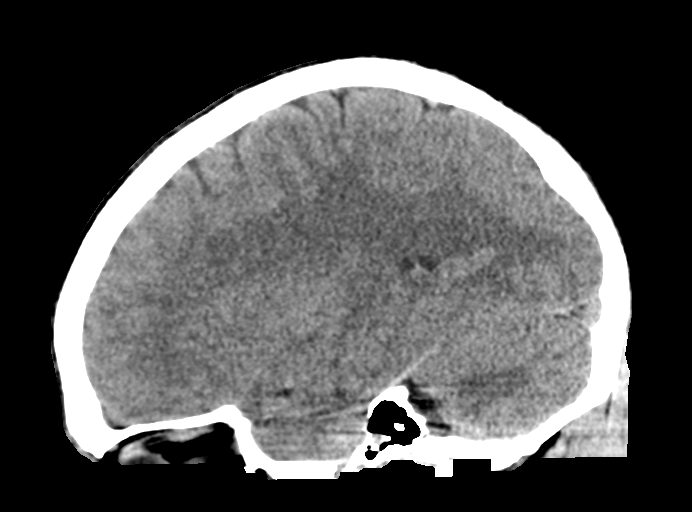
[im 28/56  brain]
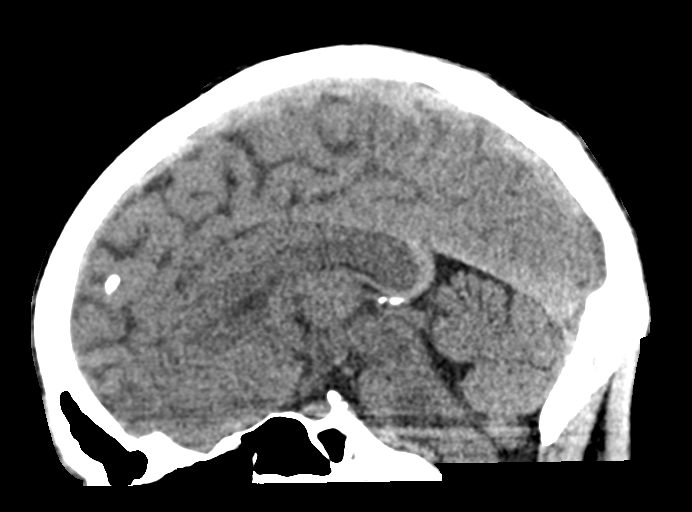
[im 37/56  brain]
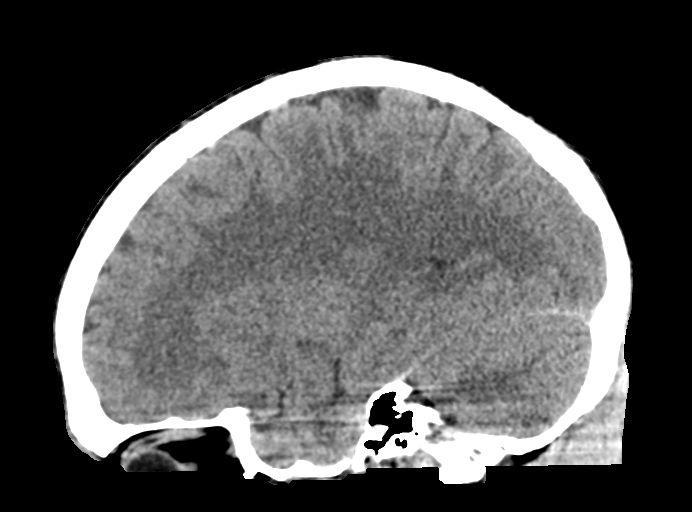

[15 of 47 positions shown; findings below may reference images not displayed]

FINDINGS: Brain: No acute intracranial abnormality. Specifically, no
hemorrhage, hydrocephalus, mass lesion, acute infarction, or
significant intracranial injury.

Vascular: No hyperdense vessel or unexpected calcification.

Skull: No acute calvarial abnormality.

Sinuses/Orbits: Visualized paranasal sinuses and mastoids clear.
Orbital soft tissues unremarkable.

Other: None
IMPRESSION: Normal study.
